# Patient Record
Sex: Male | Born: 1975
Health system: Southern US, Community
[De-identification: ages and names within clinical notes are randomized; demographics above are authoritative.]

## PROBLEM LIST (undated history)

## (undated) DIAGNOSIS — R51 Headache: Secondary | ICD-10-CM

## (undated) DIAGNOSIS — N200 Calculus of kidney: Secondary | ICD-10-CM

## (undated) DIAGNOSIS — H409 Unspecified glaucoma: Secondary | ICD-10-CM

---

## 2009-07-02 ENCOUNTER — Emergency Department (HOSPITAL_BASED_OUTPATIENT_CLINIC_OR_DEPARTMENT_OTHER)
Admission: EM | Admit: 2009-07-02 | Discharge: 2009-07-02 | Payer: Self-pay | Source: Home / Self Care | Admitting: Emergency Medicine

## 2009-07-02 ENCOUNTER — Ambulatory Visit: Payer: Self-pay | Admitting: Diagnostic Radiology

## 2010-06-08 ENCOUNTER — Emergency Department (HOSPITAL_BASED_OUTPATIENT_CLINIC_OR_DEPARTMENT_OTHER)
Admission: EM | Admit: 2010-06-08 | Discharge: 2010-06-08 | Payer: Self-pay | Source: Home / Self Care | Admitting: Emergency Medicine

## 2010-08-16 LAB — URINE MICROSCOPIC-ADD ON

## 2010-08-16 LAB — URINALYSIS, ROUTINE W REFLEX MICROSCOPIC
Glucose, UA: NEGATIVE mg/dL
Specific Gravity, Urine: 1.018 (ref 1.005–1.030)

## 2010-08-16 LAB — CBC
MCV: 85.6 fL (ref 78.0–100.0)
Platelets: 177 10*3/uL (ref 150–400)
RBC: 5.06 MIL/uL (ref 4.22–5.81)
WBC: 3.7 10*3/uL — ABNORMAL LOW (ref 4.0–10.5)

## 2010-08-16 LAB — DIFFERENTIAL
Basophils Absolute: 0 10*3/uL (ref 0.0–0.1)
Basophils Relative: 1 % (ref 0–1)
Eosinophils Relative: 2 % (ref 0–5)
Lymphocytes Relative: 63 % — ABNORMAL HIGH (ref 12–46)
Lymphs Abs: 2.3 10*3/uL (ref 0.7–4.0)
Monocytes Relative: 11 % (ref 3–12)
Neutro Abs: 0.9 10*3/uL — ABNORMAL LOW (ref 1.7–7.7)

## 2010-08-16 LAB — BASIC METABOLIC PANEL
CO2: 27 mEq/L (ref 19–32)
Calcium: 9.6 mg/dL (ref 8.4–10.5)
GFR calc non Af Amer: >60 mL/min (ref >60)
Glucose, Bld: 88 mg/dL (ref 70–99)
Potassium: 4.5 mEq/L (ref 3.5–5.1)
Sodium: 144 mEq/L (ref 135–145)

## 2010-08-23 LAB — URINALYSIS, ROUTINE W REFLEX MICROSCOPIC
Bilirubin Urine: NEGATIVE
Nitrite: NEGATIVE
Specific Gravity, Urine: 1.021 (ref 1.005–1.030)
pH: 6 (ref 5.0–8.0)

## 2010-08-23 LAB — CBC
MCHC: 33.6 g/dL (ref 30.0–36.0)
RBC: 4.59 MIL/uL (ref 4.22–5.81)

## 2010-08-23 LAB — BASIC METABOLIC PANEL
BUN: 21 mg/dL (ref 6–23)
CO2: 30 mEq/L (ref 19–32)
Chloride: 105 mEq/L (ref 96–112)
Creatinine, Ser: 1.2 mg/dL (ref 0.4–1.5)
Potassium: 4.6 mEq/L (ref 3.5–5.1)

## 2010-08-23 LAB — DIFFERENTIAL
Basophils Absolute: 0 10*3/uL (ref 0.0–0.1)
Basophils Relative: 1 % (ref 0–1)
Eosinophils Absolute: 0 10*3/uL (ref 0.0–0.7)
Monocytes Absolute: 0.3 10*3/uL (ref 0.1–1.0)
Monocytes Relative: 8 % (ref 3–12)
Neutro Abs: 2.6 10*3/uL (ref 1.7–7.7)
Neutrophils Relative %: 55 % (ref 43–77)

## 2011-01-12 ENCOUNTER — Emergency Department (INDEPENDENT_AMBULATORY_CARE_PROVIDER_SITE_OTHER): Payer: Self-pay

## 2011-01-12 ENCOUNTER — Encounter: Payer: Self-pay | Admitting: Emergency Medicine

## 2011-01-12 ENCOUNTER — Emergency Department (HOSPITAL_BASED_OUTPATIENT_CLINIC_OR_DEPARTMENT_OTHER)
Admission: EM | Admit: 2011-01-12 | Discharge: 2011-01-12 | Disposition: A | Discharge status: Home / Self Care | Payer: Self-pay | Attending: Emergency Medicine | Admitting: Emergency Medicine

## 2011-01-12 DIAGNOSIS — N200 Calculus of kidney: Secondary | ICD-10-CM | POA: Insufficient documentation

## 2011-01-12 DIAGNOSIS — N509 Disorder of male genital organs, unspecified: Secondary | ICD-10-CM

## 2011-01-12 DIAGNOSIS — N201 Calculus of ureter: Secondary | ICD-10-CM

## 2011-01-12 DIAGNOSIS — R109 Unspecified abdominal pain: Secondary | ICD-10-CM

## 2011-01-12 LAB — BASIC METABOLIC PANEL
BUN: 17 mg/dL (ref 6–23)
Creatinine, Ser: 1.2 mg/dL (ref 0.50–1.35)
GFR calc non Af Amer: >60 mL/min (ref >60)

## 2011-01-12 LAB — URINALYSIS, ROUTINE W REFLEX MICROSCOPIC
Nitrite: NEGATIVE
Specific Gravity, Urine: 1.017 (ref 1.005–1.030)
Urobilinogen, UA: 0.2 mg/dL (ref 0.0–1.0)

## 2011-01-12 LAB — URINE MICROSCOPIC-ADD ON

## 2011-01-12 MED ORDER — HYDROMORPHONE HCL 1 MG/ML IJ SOLN
1.0000 mg | Freq: Once | INTRAMUSCULAR | Status: AC
  Administered 2011-01-12: 1 mg via INTRAVENOUS
  Filled 2011-01-12: qty 1

## 2011-01-12 MED ORDER — KETOROLAC TROMETHAMINE 30 MG/ML IJ SOLN
30.0000 mg | Freq: Once | INTRAMUSCULAR | Status: AC
  Administered 2011-01-12: 30 mg via INTRAVENOUS
  Filled 2011-01-12: qty 1

## 2011-01-12 MED ORDER — SODIUM CHLORIDE 0.9 % IV BOLUS (SEPSIS): 1000.0000 mL | Freq: Once | INTRAVENOUS | Status: DC

## 2011-01-12 MED ORDER — OXYCODONE-ACETAMINOPHEN 5-325 MG PO TABS
1.0000 | ORAL_TABLET | Freq: Once | ORAL | Status: AC
  Administered 2011-01-12: 1 via ORAL
  Filled 2011-01-12: qty 1

## 2011-01-12 MED ORDER — SODIUM CHLORIDE 0.9 % IV SOLN
8.0000 mg | Freq: Once | INTRAVENOUS | Status: AC
  Administered 2011-01-12: 8 mg via INTRAVENOUS

## 2011-01-12 MED ORDER — TAMSULOSIN HCL 0.4 MG PO CAPS: 0.4000 mg | ORAL_CAPSULE | Freq: Every day | ORAL | Status: DC

## 2011-01-12 MED ORDER — HYDROMORPHONE HCL 1 MG/ML IJ SOLN
INTRAMUSCULAR | Status: AC
  Administered 2011-01-12: 1 mg
  Filled 2011-01-12: qty 1

## 2011-01-12 MED ORDER — ONDANSETRON HCL 4 MG PO TABS: 4.0000 mg | ORAL_TABLET | Freq: Four times a day (QID) | ORAL | Status: AC

## 2011-01-12 MED ORDER — IBUPROFEN 800 MG PO TABS: 800.0000 mg | ORAL_TABLET | Freq: Three times a day (TID) | ORAL | Status: AC | PRN

## 2011-01-12 MED ORDER — ONDANSETRON HCL 4 MG/2ML IJ SOLN
INTRAMUSCULAR | Status: AC
  Filled 2011-01-12: qty 4

## 2011-01-12 MED ORDER — OXYCODONE-ACETAMINOPHEN 5-325 MG PO TABS: 1.0000 | ORAL_TABLET | Freq: Four times a day (QID) | ORAL | Status: AC | PRN

## 2011-01-12 NOTE — ED Notes (Signed)
Pt reports Hx of R kidney stones with NV and pain x 24hrs vomited 250cc in triage

## 2011-01-12 NOTE — ED Notes (Signed)
Patient is resting comfortably.Returned from CT scan pain controlled

## 2011-01-12 NOTE — ED Notes (Signed)
Family at bedside. 

## 2011-01-12 NOTE — ED Provider Notes (Signed)
History     CSN: [id number] Arrival date & time: 01/12/2011 10:31 AM  Chief Complaint  Patient presents with  . Flank Pain    flank pain with vomiting Hx of kidney stone   Patient is a 35 y.o. male presenting with flank pain. The history is provided by the patient. No language interpreter was used.  Flank Pain This is a recurrent problem. The current episode started 3 to 5 hours ago. The problem occurs constantly. The problem has been gradually worsening. Associated symptoms include abdominal pain. Pertinent negatives include no chest pain, no headaches and no shortness of breath. The symptoms are aggravated by nothing. The symptoms are relieved by nothing. He has tried nothing for the symptoms.   The patient has 10 out of 10 left flank pain with radiation to the left testicle. Patient does not have a history of testicular trauma. He denies any history of exposure to STDs or penile discharge. Patient does have history of kidney stones previously. His pain is similar to that. The patient arrived actively vomiting. Patient has not required surgical intervention for this in the past.  History reviewed. No pertinent past medical history.  History reviewed. No pertinent past surgical history.  History reviewed. No pertinent family history.  History  Substance Use Topics  . Smoking status: Never Smoker   . Smokeless tobacco: Not on file  . Alcohol Use: No      Review of Systems  Constitutional: Negative.   HENT: Negative.   Eyes: Negative.   Respiratory: Negative.  Negative for shortness of breath.   Cardiovascular: Negative.  Negative for chest pain.  Gastrointestinal: Positive for nausea, vomiting and abdominal pain.  Genitourinary: Positive for flank pain and testicular pain. Negative for dysuria, frequency, hematuria, discharge, difficulty urinating and penile pain.  Musculoskeletal: Negative.   Skin: Negative.   Neurological: Negative.  Negative for headaches.  Hematological:  Negative.   Psychiatric/Behavioral: Negative.   All other systems reviewed and are negative.    Physical Exam  BP 145/85  Pulse 110  Temp(Src) 99 F (37.2 C) (Oral)  Resp 24  SpO2 99%  Physical Exam  Nursing note and vitals reviewed. Constitutional: He is oriented to person, place, and time. He appears well-developed and well-nourished. He appears distressed.  HENT:  Head: Normocephalic and atraumatic.  Eyes: Pupils are equal, round, and reactive to light.  Neck: Normal range of motion.  Cardiovascular: Regular rhythm.  Tachycardia present.   Pulmonary/Chest: Effort normal and breath sounds normal.  Abdominal: Soft. There is tenderness in the left lower quadrant. There is CVA tenderness. There is no rigidity and no guarding.  Genitourinary: Penis normal. Left testis shows tenderness. Left testis shows no swelling. Left testis is descended. Cremasteric reflex is not absent on the left side. Circumcised.       Patient has curve of penis to right  Musculoskeletal: Normal range of motion.  Neurological: He is alert and oriented to person, place, and time. No cranial nerve deficit. He exhibits normal muscle tone. Coordination normal.  Skin: Skin is warm. No rash noted. He is diaphoretic.  Psychiatric: He has a normal mood and affect.    ED Course  Procedures  MDM Patient was examined for his symptoms. He was treated symptomatically with Dilaudid and Zofran given the extent of his comfort. Patient received 2 L of normal saline IV bolus. Iced patient received very little relief I did order a ultrasound of his testicles to ensure that there was no testicular torsion today.  This was negative. Patient did have a CT scan nonconstant which showed no obstructing stones. Patient did not have significant change in his creatinine. Patient was given Toradol 30 mg IV and a tab of Percocet by mouth. He was able to tolerate by mouth intake and therefore was able to be discharged home with  prescriptions for Motrin, Percocet, Zofran, and Flomax. Patient has seen a urologist previously and can followup with them if this continues to be a problem. Patient and family were told that the patient can return if he is unable to tolerate by mouth intake or has worsening of his pain despite therapies provided.  Assessment: 35 year old male who presents today with left flank pain and nonobstructive nephrolithiasis.  Plan: As above.      Cyndra Numbers, MD 01/12/11 [phone]

## 2011-01-12 NOTE — ED Notes (Signed)
Pt verbalizes use of meds wife at side

## 2014-02-07 ENCOUNTER — Encounter (HOSPITAL_BASED_OUTPATIENT_CLINIC_OR_DEPARTMENT_OTHER): Payer: Self-pay | Admitting: Emergency Medicine

## 2014-02-07 ENCOUNTER — Emergency Department (HOSPITAL_BASED_OUTPATIENT_CLINIC_OR_DEPARTMENT_OTHER)
Admission: EM | Admit: 2014-02-07 | Discharge: 2014-02-07 | Disposition: A | Discharge status: Home / Self Care | Payer: PRIVATE HEALTH INSURANCE | Attending: Emergency Medicine | Admitting: Emergency Medicine

## 2014-02-07 DIAGNOSIS — R112 Nausea with vomiting, unspecified: Secondary | ICD-10-CM | POA: Insufficient documentation

## 2014-02-07 DIAGNOSIS — R109 Unspecified abdominal pain: Secondary | ICD-10-CM | POA: Insufficient documentation

## 2014-02-07 DIAGNOSIS — Z87442 Personal history of urinary calculi: Secondary | ICD-10-CM | POA: Diagnosis not present

## 2014-02-07 DIAGNOSIS — R319 Hematuria, unspecified: Secondary | ICD-10-CM | POA: Diagnosis not present

## 2014-02-07 DIAGNOSIS — Z79899 Other long term (current) drug therapy: Secondary | ICD-10-CM | POA: Diagnosis not present

## 2014-02-07 LAB — URINALYSIS, ROUTINE W REFLEX MICROSCOPIC
Bilirubin Urine: NEGATIVE
Glucose, UA: NEGATIVE mg/dL
KETONES UR: NEGATIVE mg/dL
NITRITE: NEGATIVE
PROTEIN: 100 mg/dL — AB
Specific Gravity, Urine: 1.015 (ref 1.005–1.030)
UROBILINOGEN UA: 0.2 mg/dL (ref 0.0–1.0)
pH: 5.5 (ref 5.0–8.0)

## 2014-02-07 LAB — URINE MICROSCOPIC-ADD ON

## 2014-02-07 MED ORDER — PROMETHAZINE HCL 25 MG PO TABS: 25.0000 mg | ORAL_TABLET | Freq: Four times a day (QID) | ORAL | Status: AC | PRN

## 2014-02-07 MED ORDER — ONDANSETRON HCL 4 MG/2ML IJ SOLN
4.0000 mg | Freq: Once | INTRAMUSCULAR | Status: AC
  Administered 2014-02-07: 4 mg via INTRAVENOUS
  Filled 2014-02-07: qty 2

## 2014-02-07 MED ORDER — OXYCODONE-ACETAMINOPHEN 5-325 MG PO TABS: 2.0000 | ORAL_TABLET | ORAL | Status: DC | PRN

## 2014-02-07 MED ORDER — KETOROLAC TROMETHAMINE 30 MG/ML IJ SOLN
30.0000 mg | Freq: Once | INTRAMUSCULAR | Status: AC
  Administered 2014-02-07: 30 mg via INTRAVENOUS
  Filled 2014-02-07: qty 1

## 2014-02-07 MED ORDER — FENTANYL CITRATE 0.05 MG/ML IJ SOLN
50.0000 ug | Freq: Once | INTRAMUSCULAR | Status: AC
  Administered 2014-02-07: 50 ug via INTRAVENOUS
  Filled 2014-02-07: qty 2

## 2014-02-07 MED ORDER — HYDROMORPHONE HCL PF 1 MG/ML IJ SOLN
1.0000 mg | Freq: Once | INTRAMUSCULAR | Status: AC
  Administered 2014-02-07: 1 mg via INTRAVENOUS
  Filled 2014-02-07: qty 1

## 2014-02-07 NOTE — ED Provider Notes (Signed)
CSN: [id number]     Arrival date & time 02/07/14  4098 History   First MD Initiated Contact with Patient 02/07/14 [phone]     Chief Complaint  Patient presents with  . Flank Pain      Patient is a 38 y.o. male presenting with flank pain. The history is provided by the patient.  Flank Pain This is a new problem. The current episode started more than 2 days ago. The problem occurs daily. The problem has been gradually worsening. Pertinent negatives include no chest pain and no shortness of breath. Nothing aggravates the symptoms. Nothing relieves the symptoms.   Pt presents for right flank pain He reports h/o kidney stones similar tonight's pain He reports pain radiates to his right testicle No fever He reports nausea/vomiting  He denies h/o urologic procedure   PMH - none History  Substance Use Topics  . Smoking status: Never Smoker   . Smokeless tobacco: Not on file  . Alcohol Use: Yes     Comment: social    Review of Systems  Constitutional: Negative for fever.  Respiratory: Negative for shortness of breath.   Cardiovascular: Negative for chest pain.  Gastrointestinal: Positive for vomiting.  Genitourinary: Positive for flank pain.  All other systems reviewed and are negative.     Allergies  Review of patient's allergies indicates no known allergies.  Home Medications   Prior to Admission medications   Medication Sig Start Date End Date Taking? Authorizing Provider  Tamsulosin HCl (FLOMAX) 0.4 MG CAPS Take 1 capsule (0.4 mg total) by mouth daily. 01/12/11   Meagan Hunt, MD   BP 160/103  Pulse 70  Temp(Src) 98.4 F (36.9 C) (Oral)  Resp 20  Ht  (1.676 m)  Wt 165 lb (74.844 kg)  BMI 26.64 kg/m2  SpO2 98% Physical Exam CONSTITUTIONAL: Well developed/well nourished HEAD: Normocephalic/atraumatic EYES: EOMI/PERRL ENMT: Mucous membranes moist NECK: supple no meningeal signs SPINE:entire spine nontender CV: S1/S2 noted, no murmurs/rubs/gallops noted LUNGS:  Lungs are clear to auscultation bilaterally, no apparent distress ABDOMEN: soft, nontender, no rebound or guarding YN:WGNFA cva tenderness No inguinal hernia.  Mild scrotal tenderness but no edema/erythema noted.  family member at bedside during exam at patient request NEURO: Pt is awake/alert, moves all extremitiesx4 EXTREMITIES: pulses normal, full ROM SKIN: warm, color normal PSYCH: no abnormalities of mood noted  ED Course  Procedures  4:10 AM Suspect kidney stone Will treat pain and reassess 5:30 AM Pt improving Previous CT imaging revealed kidney stones bilaterally.  Suspect ureteral colic that is improving and he is without fever or signs of infection.  Will defer imaging for now 5:48 AM Pt improved and taking PO fluids He is resting comfortably Low suspicion for acute abdominal emergency He has no signs of testicular torsion We discussed strict return precautions Stable for d/c home Labs Review Labs Reviewed  URINALYSIS, ROUTINE W REFLEX MICROSCOPIC - Abnormal; Notable for the following:    Color, Urine RED (*)    APPearance CLOUDY (*)    Hgb urine dipstick LARGE (*)    Protein, ur 100 (*)    Leukocytes, UA TRACE (*)    All other components within normal limits  URINE MICROSCOPIC-ADD ON     MDM   Final diagnoses:  Flank pain  Hematuria    Nursing notes including past medical history and social history reviewed and considered in documentation Labs/vital reviewed and considered Previous records reviewed and considered     Joya Gaskins, MD 02/07/14  0549 

## 2014-02-07 NOTE — ED Notes (Signed)
Pt c/o rt flank pain with n/v, hx of kidney stones, c/o hematuria

## 2014-02-09 ENCOUNTER — Encounter (HOSPITAL_BASED_OUTPATIENT_CLINIC_OR_DEPARTMENT_OTHER): Payer: Self-pay | Admitting: Emergency Medicine

## 2014-02-09 ENCOUNTER — Emergency Department (HOSPITAL_BASED_OUTPATIENT_CLINIC_OR_DEPARTMENT_OTHER): Payer: PRIVATE HEALTH INSURANCE

## 2014-02-09 ENCOUNTER — Inpatient Hospital Stay (HOSPITAL_BASED_OUTPATIENT_CLINIC_OR_DEPARTMENT_OTHER)
Admission: EM | Admit: 2014-02-09 | Discharge: 2014-02-10 | DRG: 684 | Disposition: A | Discharge status: Home / Self Care | Payer: PRIVATE HEALTH INSURANCE | Attending: Internal Medicine | Admitting: Internal Medicine

## 2014-02-09 DIAGNOSIS — E860 Dehydration: Secondary | ICD-10-CM | POA: Diagnosis present

## 2014-02-09 DIAGNOSIS — Z79899 Other long term (current) drug therapy: Secondary | ICD-10-CM | POA: Diagnosis not present

## 2014-02-09 DIAGNOSIS — R112 Nausea with vomiting, unspecified: Secondary | ICD-10-CM

## 2014-02-09 DIAGNOSIS — H409 Unspecified glaucoma: Secondary | ICD-10-CM | POA: Diagnosis present

## 2014-02-09 DIAGNOSIS — N200 Calculus of kidney: Secondary | ICD-10-CM

## 2014-02-09 DIAGNOSIS — N179 Acute kidney failure, unspecified: Secondary | ICD-10-CM | POA: Diagnosis present

## 2014-02-09 DIAGNOSIS — R109 Unspecified abdominal pain: Secondary | ICD-10-CM | POA: Diagnosis present

## 2014-02-09 HISTORY — DX: Headache: R51

## 2014-02-09 HISTORY — DX: Unspecified glaucoma: H40.9

## 2014-02-09 HISTORY — DX: Calculus of kidney: N20.0

## 2014-02-09 LAB — CBC WITH DIFFERENTIAL/PLATELET
Basophils Absolute: 0 K/uL (ref 0.0–0.1)
Basophils Relative: 0 % (ref 0–1)
Eosinophils Absolute: 0 K/uL (ref 0.0–0.7)
Eosinophils Relative: 0 % (ref 0–5)
HCT: 38.6 % — ABNORMAL LOW (ref 39.0–52.0)
Hemoglobin: 13 g/dL (ref 13.0–17.0)
Lymphocytes Relative: 9 % — ABNORMAL LOW (ref 12–46)
Lymphs Abs: 0.6 K/uL — ABNORMAL LOW (ref 0.7–4.0)
MCH: 30.1 pg (ref 26.0–34.0)
MCHC: 33.7 g/dL (ref 30.0–36.0)
MCV: 89.4 fL (ref 78.0–100.0)
Monocytes Absolute: 0.5 K/uL (ref 0.1–1.0)
Monocytes Relative: 7 % (ref 3–12)
Neutro Abs: 5.9 K/uL (ref 1.7–7.7)
Neutrophils Relative %: 84 % — ABNORMAL HIGH (ref 43–77)
Platelets: 125 K/uL — ABNORMAL LOW (ref 150–400)
RBC: 4.32 MIL/uL (ref 4.22–5.81)
RDW: 11.5 % (ref 11.5–15.5)
WBC: 7 K/uL (ref 4.0–10.5)

## 2014-02-09 LAB — BASIC METABOLIC PANEL
ANION GAP: 16 — AB (ref 5–15)
BUN: 13 mg/dL (ref 6–23)
CHLORIDE: 99 meq/L (ref 96–112)
CO2: 24 mEq/L (ref 19–32)
Calcium: 10.1 mg/dL (ref 8.4–10.5)
Creatinine, Ser: 1.6 mg/dL — ABNORMAL HIGH (ref 0.50–1.35)
GFR calc non Af Amer: 53 mL/min — ABNORMAL LOW (ref >90)
GFR, EST AFRICAN AMERICAN: 61 mL/min — AB (ref >90)
Glucose, Bld: 103 mg/dL — ABNORMAL HIGH (ref 70–99)
POTASSIUM: 4.4 meq/L (ref 3.7–5.3)
Sodium: 139 mEq/L (ref 137–147)

## 2014-02-09 LAB — URINALYSIS, ROUTINE W REFLEX MICROSCOPIC
BILIRUBIN URINE: NEGATIVE
Glucose, UA: NEGATIVE mg/dL
Leukocytes, UA: NEGATIVE
NITRITE: NEGATIVE
PROTEIN: 30 mg/dL — AB
Specific Gravity, Urine: 1.018 (ref 1.005–1.030)
Urobilinogen, UA: 1 mg/dL (ref 0.0–1.0)
pH: 8 (ref 5.0–8.0)

## 2014-02-09 LAB — URINE MICROSCOPIC-ADD ON

## 2014-02-09 MED ORDER — OXYCODONE-ACETAMINOPHEN 5-325 MG PO TABS: 2.0000 | ORAL_TABLET | ORAL | Status: DC | PRN

## 2014-02-09 MED ORDER — SODIUM CHLORIDE 0.9 % IV BOLUS (SEPSIS)
1000.0000 mL | Freq: Once | INTRAVENOUS | Status: AC
  Administered 2014-02-09: 1000 mL via INTRAVENOUS

## 2014-02-09 MED ORDER — MORPHINE SULFATE 4 MG/ML IJ SOLN
4.0000 mg | INTRAMUSCULAR | Status: DC | PRN
  Administered 2014-02-09 – 2014-02-10 (×3): 4 mg via INTRAVENOUS
  Filled 2014-02-09 (×3): qty 1

## 2014-02-09 MED ORDER — ACETAMINOPHEN 325 MG PO TABS: 650.0000 mg | ORAL_TABLET | Freq: Four times a day (QID) | ORAL | Status: DC | PRN

## 2014-02-09 MED ORDER — ONDANSETRON HCL 4 MG/2ML IJ SOLN: 4.0000 mg | Freq: Three times a day (TID) | INTRAMUSCULAR | Status: AC | PRN

## 2014-02-09 MED ORDER — HYDROMORPHONE HCL PF 1 MG/ML IJ SOLN
INTRAMUSCULAR | Status: AC
  Administered 2014-02-09: 1 mg via INTRAVENOUS
  Filled 2014-02-09: qty 1

## 2014-02-09 MED ORDER — DOCUSATE SODIUM 100 MG PO CAPS
100.0000 mg | ORAL_CAPSULE | Freq: Two times a day (BID) | ORAL | Status: DC
  Administered 2014-02-10: 100 mg via ORAL
  Filled 2014-02-09 (×3): qty 1

## 2014-02-09 MED ORDER — ACETAMINOPHEN 650 MG RE SUPP: 650.0000 mg | Freq: Four times a day (QID) | RECTAL | Status: DC | PRN

## 2014-02-09 MED ORDER — HYDROMORPHONE HCL PF 1 MG/ML IJ SOLN
1.0000 mg | Freq: Once | INTRAMUSCULAR | Status: AC
  Administered 2014-02-09: 1 mg via INTRAVENOUS
  Filled 2014-02-09: qty 1

## 2014-02-09 MED ORDER — DEXTROSE 5 % IV SOLN
1.0000 g | Freq: Once | INTRAVENOUS | Status: AC
  Administered 2014-02-09: 1 g via INTRAVENOUS

## 2014-02-09 MED ORDER — HYDROMORPHONE HCL PF 1 MG/ML IJ SOLN
1.0000 mg | Freq: Once | INTRAMUSCULAR | Status: AC
  Administered 2014-02-09: 1 mg via INTRAVENOUS

## 2014-02-09 MED ORDER — HYDROMORPHONE HCL PF 1 MG/ML IJ SOLN
1.0000 mg | INTRAMUSCULAR | Status: AC | PRN
  Administered 2014-02-10 (×2): 1 mg via INTRAVENOUS
  Filled 2014-02-09 (×2): qty 1

## 2014-02-09 MED ORDER — DEXTROSE 5 % IV SOLN: 1.0000 g | INTRAVENOUS | Status: DC

## 2014-02-09 MED ORDER — ONDANSETRON HCL 4 MG/2ML IJ SOLN
4.0000 mg | Freq: Once | INTRAMUSCULAR | Status: AC
  Administered 2014-02-09: 4 mg via INTRAVENOUS
  Filled 2014-02-09: qty 2

## 2014-02-09 MED ORDER — SODIUM CHLORIDE 0.9 % IV SOLN
INTRAVENOUS | Status: AC
  Administered 2014-02-10 (×2): via INTRAVENOUS

## 2014-02-09 NOTE — ED Notes (Signed)
Patient here with increasing right groin/flank pain x 1 day, hx of kidney stones and reports this time cannot pass. Did not get CT scan 2 days ago at that visit. Unable to keep the oxycodone nor the phenergan down.

## 2014-02-09 NOTE — ED Provider Notes (Signed)
CSN: [id number]     Arrival date & time 02/09/14  1818 History   First MD Initiated Contact with Patient 02/09/14 1839     Chief Complaint  Patient presents with  . Flank Pain     (Consider location/radiation/quality/duration/timing/severity/associated sxs/prior Treatment) HPI  Gary Peters is a 38 y.o. male complaining of severe pain in the right flank radiating to the right groin and testicle associated with several episodes of nonbloody, nonbilious, no coffee-ground emesis. Patient had a history of multiple kidney stones in the past, this feels similar. He was seen several days ago and given prescription for Percocet and Phenergan, however pain is severe and he's been vomiting up the pain medication. Patient denies fever, chills, dysuria or urethral discharge. States that he is followed at [facility] in the past, she has not required any intervention to pass stones in the past. When he was seen 2 days ago no imaging was performed. Patient states that he passed one stone on the left side yesterday. He has brought the stone with him.  Past Medical History  Diagnosis Date  . Kidney stones   . Headache(784.0)   . Nephrolithiasis 02/09/2014   History reviewed. No pertinent past surgical history. History reviewed. No pertinent family history. History  Substance Use Topics  . Smoking status: Never Smoker   . Smokeless tobacco: Not on file  . Alcohol Use: 1.2 oz/week    2 Glasses of wine per week     Comment: social    Review of Systems  10 systems reviewed and found to be negative, except as noted in the HPI.   Allergies  Other  Home Medications   Prior to Admission medications   Medication Sig Start Date End Date Taking? Authorizing Provider  brimonidine-timolol (COMBIGAN) 0.2-0.5 % ophthalmic solution Place 1 drop into both eyes every 12 (twelve) hours.   Yes Historical Provider, MD  brinzolamide (AZOPT) 1 % ophthalmic suspension Place 1 drop into both eyes 2 (two)  times daily.   Yes Historical Provider, MD  oxyCODONE-acetaminophen (PERCOCET/ROXICET) 5-325 MG per tablet Take 2 tablets by mouth every 4 (four) hours as needed for severe pain. 02/07/14  Yes Joya Gaskins, MD  promethazine (PHENERGAN) 25 MG tablet Take 1 tablet (25 mg total) by mouth every 6 (six) hours as needed for nausea or vomiting. 02/07/14  Yes Joya Gaskins, MD  Travoprost, BAK Free, (TRAVATAN) 0.004 % SOLN ophthalmic solution Place 1 drop into both eyes at bedtime.   Yes Historical Provider, MD  valACYclovir (VALTREX) 500 MG tablet Take 500 mg by mouth 2 (two) times daily.   Yes Historical Provider, MD   BP 165/95  Pulse 73  Temp(Src) 98.2 F (36.8 C) (Oral)  Resp 20  Ht  (1.702 m)  Wt 161 lb 2.5 oz (73.1 kg)  BMI 25.23 kg/m2  SpO2 95% Physical Exam  Nursing note and vitals reviewed. Constitutional: He is oriented to person, place, and time. He appears well-developed and well-nourished. No distress.  Appears tired  HENT:  Head: Normocephalic.  Eyes: Conjunctivae and EOM are normal.  Cardiovascular: Normal rate, regular rhythm and intact distal pulses.   Pulmonary/Chest: Effort normal and breath sounds normal. No stridor. No respiratory distress. He has no wheezes. He has no rales. He exhibits no tenderness.  Abdominal: Soft. Bowel sounds are normal. He exhibits no distension and no mass. There is no tenderness. There is no rebound and no guarding.  Genitourinary:  Bilateral CVA tenderness to palpation, worse  on the right than the left.  Musculoskeletal: Normal range of motion.  Neurological: He is alert and oriented to person, place, and time.  Psychiatric: He has a normal mood and affect.    ED Course  Procedures (including critical care time) Labs Review Labs Reviewed  CBC WITH DIFFERENTIAL - Abnormal; Notable for the following:    HCT 38.6 (*)    Platelets 125 (*)    Neutrophils Relative % 84 (*)    Lymphocytes Relative 9 (*)    Lymphs Abs 0.6 (*)     All other components within normal limits  URINALYSIS, ROUTINE W REFLEX MICROSCOPIC - Abnormal; Notable for the following:    APPearance CLOUDY (*)    Hgb urine dipstick LARGE (*)    Ketones, ur >80 (*)    Protein, ur 30 (*)    All other components within normal limits  BASIC METABOLIC PANEL - Abnormal; Notable for the following:    Glucose, Bld 103 (*)    Creatinine, Ser 1.60 (*)    GFR calc non Af Amer 53 (*)    GFR calc Af Amer 61 (*)    Anion gap 16 (*)    All other components within normal limits  URINE MICROSCOPIC-ADD ON - Abnormal; Notable for the following:    Bacteria, UA MANY (*)    All other components within normal limits  URINE CULTURE  MAGNESIUM  PHOSPHORUS  TSH  COMPREHENSIVE METABOLIC PANEL  CBC  CREATININE, URINE, RANDOM  SODIUM, URINE, RANDOM    Imaging Review Ct Abdomen Pelvis Wo Contrast  02/09/2014   CLINICAL DATA:  Right flank pain 1 day. History of kidney stones. Nausea and vomiting.  EXAM: CT ABDOMEN AND PELVIS WITHOUT CONTRAST  TECHNIQUE: Multidetector CT imaging of the abdomen and pelvis was performed following the standard protocol without IV contrast.  COMPARISON:  12/16/2011, 07/19/2011 and 01/12/2011.  FINDINGS: Lung bases are within normal.  Abdominal images demonstrate the liver, spleen, pancreas, gallbladder and adrenal glands to be within normal. The appendix is retrocecal in location and otherwise normal. There is mild diverticulosis of the colon.  Kidneys are normal in size with multiple bilateral stones. There is a 1 cm cyst over the mid pole of the right kidney. There is minimal dilatation of the right intrarenal collecting system. There is a single stone versus 2 adjacent stones over the right mid ureter measuring 6-7 mm when measured together. Left ureter is normal.  Pelvic images demonstrate a normal bladder, prostate and rectum. There is a tiny amount of free fluid. Remaining bones soft tissues are within normal.  IMPRESSION: Moderate bilateral  nephrolithiasis. Single stone versus 2 adjacent stones measuring 6-7 mm when measured together over the right mid ureter causing low-grade obstruction.  1 cm right renal cyst.  Mild diverticulosis of the colon.   Electronically Signed   By: Elberta Fortis M.D.   On: 02/09/2014 19:02     EKG Interpretation None      MDM   Final diagnoses:  AKI (acute kidney injury)  Kidney stone    Filed Vitals:   02/09/14 1828 02/09/14 2153 02/09/14 2310  BP: 147/103 124/81 165/95  Pulse: 77 71 73  Temp: 98 F (36.7 C) 98.6 F (37 C) 98.2 F (36.8 C)  TempSrc: Oral Oral Oral  Resp: Height:    (1.702 m)  Weight:   161 lb 2.5 oz (73.1 kg)  SpO2: 100% 96% 95%    Medications  morphine 4  MG/ML injection 4 mg (4 mg Intravenous Given 02/09/14 2234)  HYDROmorphone (DILAUDID) injection 1 mg (1 mg Intravenous Given 02/10/14 0014)  ondansetron (ZOFRAN) injection 4 mg (not administered)  cefTRIAXone (ROCEPHIN) 1 g in dextrose 5 % 50 mL IVPB (not administered)  oxyCODONE-acetaminophen (PERCOCET/ROXICET) 5-325 MG per tablet 2 tablet (not administered)  acetaminophen (TYLENOL) tablet 650 mg (not administered)    Or  acetaminophen (TYLENOL) suppository 650 mg (not administered)  docusate sodium (COLACE) capsule 100 mg (100 mg Oral Not Given 02/10/14 0000)  0.9 %  sodium chloride infusion ( Intravenous New Bag/Given 02/10/14 0014)  sodium chloride 0.9 % bolus 1,000 mL (1,000 mLs Intravenous New Bag/Given 02/09/14 1916)  ondansetron (ZOFRAN) injection 4 mg (4 mg Intravenous Given 02/09/14 1916)  HYDROmorphone (DILAUDID) injection 1 mg (1 mg Intravenous Given 02/09/14 2000)  HYDROmorphone (DILAUDID) injection 1 mg (1 mg Intravenous Given 02/09/14 2118)  sodium chloride 0.9 % bolus 1,000 mL (1,000 mLs Intravenous New Bag/Given 02/09/14 2118)  cefTRIAXone (ROCEPHIN) 1 g in dextrose 5 % 50 mL IVPB (1 g Intravenous New Bag/Given 02/09/14 2236)    Gary Peters is a 38 y.o. male presenting with right flank pain  consistent with prior kidney stones. CT shows single stone versus 2 adjacent stones measuring 6-7 mm. There is mild bilateral hydronephrosis.   8:20 p.m.: Patient seen and reassess at the bedside. States that his pain is improved slightly to 7/10. Nausea has resolved.  Urinalysis shows ketones and many bacteria, no white blood cells, nitrites or leukocytes, we'll hold off on antibiotic administration at this time as patient is afebrile with no white blood cell count and normal vital signs. Urine culture is ordered.  Patient's creatinine is elevated at 1.60, Toradol is contraindicated. Patient will be given another dose of Dilaudid.Marland Kitchen He is dehydrated with greater than 80 ketones in the urine. Patient also has a mildly reduced platelet count at 125.  Multiple doses of Dilaudid her not controlling the patient's pain. Considering the acute kidney injury and the large stone he will need admission to the hospital and urologic consult.  Case discussed with [facility] hospitalist Dr. Adela Glimpse who accepts admission to [facility]. She has asked me to query him on alcohol consumption to 2 low platelet count. Patient states he drinks proximally 3 glasses of wine per week.  Neurology consult from Dr. Berneice Heinrich appreciated: We have discussed the findings of the urinalysis and he does not have any specific recommendations for antibiotics. Patient will be given 1 g of Rocephin. I have asked that the urology service evaluate him in the a.m.     Wynetta Emery, PA-C 02/10/14 [phone]

## 2014-02-10 ENCOUNTER — Encounter (HOSPITAL_COMMUNITY): Payer: Self-pay | Admitting: *Deleted

## 2014-02-10 DIAGNOSIS — R112 Nausea with vomiting, unspecified: Secondary | ICD-10-CM | POA: Diagnosis present

## 2014-02-10 LAB — URINE CULTURE
Colony Count: NO GROWTH
Culture: NO GROWTH

## 2014-02-10 LAB — COMPREHENSIVE METABOLIC PANEL
ALT: 12 U/L (ref 0–53)
ANION GAP: 12 (ref 5–15)
AST: 18 U/L (ref 0–37)
Albumin: 3.4 g/dL — ABNORMAL LOW (ref 3.5–5.2)
Alkaline Phosphatase: 35 U/L — ABNORMAL LOW (ref 39–117)
BUN: 12 mg/dL (ref 6–23)
CO2: 26 mEq/L (ref 19–32)
Calcium: 8.9 mg/dL (ref 8.4–10.5)
Chloride: 100 mEq/L (ref 96–112)
Creatinine, Ser: 1.29 mg/dL (ref 0.50–1.35)
GFR calc Af Amer: 79 mL/min — ABNORMAL LOW (ref >90)
GFR calc non Af Amer: 68 mL/min — ABNORMAL LOW (ref >90)
Glucose, Bld: 111 mg/dL — ABNORMAL HIGH (ref 70–99)
POTASSIUM: 4 meq/L (ref 3.7–5.3)
Sodium: 138 mEq/L (ref 137–147)
TOTAL PROTEIN: 7.1 g/dL (ref 6.0–8.3)
Total Bilirubin: 0.4 mg/dL (ref 0.3–1.2)

## 2014-02-10 LAB — PHOSPHORUS: PHOSPHORUS: 3.3 mg/dL (ref 2.3–4.6)

## 2014-02-10 LAB — CBC
HEMATOCRIT: 41.2 % (ref 39.0–52.0)
Hemoglobin: 13.5 g/dL (ref 13.0–17.0)
MCH: 29.4 pg (ref 26.0–34.0)
MCHC: 32.8 g/dL (ref 30.0–36.0)
MCV: 89.8 fL (ref 78.0–100.0)
Platelets: 179 10*3/uL (ref 150–400)
RBC: 4.59 MIL/uL (ref 4.22–5.81)
RDW: 11.9 % (ref 11.5–15.5)
WBC: 7.9 10*3/uL (ref 4.0–10.5)

## 2014-02-10 LAB — CREATININE, URINE, RANDOM: Creatinine, Urine: 183.1 mg/dL

## 2014-02-10 LAB — SODIUM, URINE, RANDOM: SODIUM UR: 116 meq/L

## 2014-02-10 LAB — MAGNESIUM: Magnesium: 1.8 mg/dL (ref 1.5–2.5)

## 2014-02-10 LAB — TSH: TSH: 0.801 u[IU]/mL (ref 0.350–4.500)

## 2014-02-10 MED ORDER — BRIMONIDINE TARTRATE-TIMOLOL 0.2-0.5 % OP SOLN
1.0000 [drp] | Freq: Two times a day (BID) | OPHTHALMIC | Status: DC
Start: 2014-02-10 — End: 2014-02-10

## 2014-02-10 MED ORDER — TAMSULOSIN HCL 0.4 MG PO CAPS
0.4000 mg | ORAL_CAPSULE | Freq: Every day | ORAL | Status: DC
  Administered 2014-02-10: 0.4 mg via ORAL
  Filled 2014-02-10: qty 1

## 2014-02-10 MED ORDER — ONDANSETRON HCL 4 MG PO TABS: 4.0000 mg | ORAL_TABLET | Freq: Three times a day (TID) | ORAL | Status: AC | PRN

## 2014-02-10 MED ORDER — ONDANSETRON HCL 4 MG PO TABS: 4.0000 mg | ORAL_TABLET | Freq: Three times a day (TID) | ORAL | Status: DC | PRN

## 2014-02-10 MED ORDER — VALACYCLOVIR HCL 500 MG PO TABS
500.0000 mg | ORAL_TABLET | Freq: Every day | ORAL | Status: DC
  Administered 2014-02-10: 500 mg via ORAL
  Filled 2014-02-10: qty 1

## 2014-02-10 MED ORDER — BRIMONIDINE TARTRATE 0.2 % OP SOLN
1.0000 [drp] | Freq: Two times a day (BID) | OPHTHALMIC | Status: DC
Start: 2014-02-10 — End: 2014-02-10
  Administered 2014-02-10 (×2): 1 [drp] via OPHTHALMIC
  Filled 2014-02-10: qty 5

## 2014-02-10 MED ORDER — TIMOLOL MALEATE 0.5 % OP SOLN
1.0000 [drp] | Freq: Two times a day (BID) | OPHTHALMIC | Status: DC
  Administered 2014-02-10 (×2): 1 [drp] via OPHTHALMIC
  Filled 2014-02-10: qty 5

## 2014-02-10 MED ORDER — KETOROLAC TROMETHAMINE 30 MG/ML IJ SOLN
30.0000 mg | Freq: Once | INTRAMUSCULAR | Status: AC
  Administered 2014-02-10: 30 mg via INTRAVENOUS
  Filled 2014-02-10: qty 1

## 2014-02-10 MED ORDER — BRINZOLAMIDE 1 % OP SUSP
1.0000 [drp] | Freq: Two times a day (BID) | OPHTHALMIC | Status: DC
  Administered 2014-02-10 (×2): 1 [drp] via OPHTHALMIC
  Filled 2014-02-10: qty 10

## 2014-02-10 MED ORDER — OXYCODONE-ACETAMINOPHEN 5-325 MG PO TABS: 1.0000 | ORAL_TABLET | Freq: Four times a day (QID) | ORAL | Status: AC | PRN

## 2014-02-10 MED ORDER — LATANOPROST 0.005 % OP SOLN
1.0000 [drp] | Freq: Every day | OPHTHALMIC | Status: DC
  Filled 2014-02-10: qty 2.5

## 2014-02-10 NOTE — Progress Notes (Signed)
Patient arrived via care link from [facility]. Patient is alert and oriented, no respiratory difficulty noted. Reports bilateral flank pain 7/10 at this time, states left is worse. Admitting doctor notified of arrival.

## 2014-02-10 NOTE — Care Management Note (Signed)
    Page 1 of 1   02/10/2014     12:42:57 PM CARE MANAGEMENT NOTE 02/10/2014  Patient:  Gary Peters, Gary Peters   Account Number:  [id number]  Date Initiated:  02/10/2014  Documentation initiated by:  Lanier Clam  Subjective/Objective Assessment:   38 Y/O M ADMITTED Gary Peters.     Action/Plan:   FROM HOME.   Anticipated DC Date:  02/10/2014   Anticipated DC Plan:  HOME/SELF CARE      DC Planning Services  CM consult      Choice offered to / List presented to:             Status of service:  Completed, signed off Medicare Important Message given?   (If response is "NO", the following Medicare IM given date fields will be blank) Date Medicare IM given:   Medicare IM given by:   Date Additional Medicare IM given:   Additional Medicare IM given by:    Discharge Disposition:  HOME/SELF CARE  Per UR Regulation:  Reviewed for med. necessity/level of care/duration of stay  If discussed at Long Length of Stay Meetings, dates discussed:    Comments:  02/10/14 Gary Giarratano RN,BSN NCM [phone] NO D/C NEEDS OR ORDERS.

## 2014-02-10 NOTE — H&P (Signed)
PCP: Almedia Balls, MD [place]   Chief Complaint:  Back pain  HPI: Gary Peters is a 38 y.o. male   has a past medical history of Kidney stones; Headache(784.0); Nephrolithiasis (02/09/2014); and Glaucoma.   Presented with  1 week hx of back pain radiating to groin typical for his kidney stone attack. # days ago he has passed a stone but still has sensation of discomfort on the right. CT scan showed possibly 2 stones measuring together as 6 mm vs one stone of that size. Patient reports that the pain is now desended to his right testicle.  He denies any fever or chills. Urology (Dr. Berneice Heinrich) have been consulted and will see patient in AM.   Hospitalist was called for admission for ARF in the setting of dehydration and   Review of Systems:    Pertinent positives include:abdominal pain, back pain testicular pain   nausea, vomiting,   Constitutional:  No weight loss, night sweats, Fevers, chills, fatigue, weight loss  HEENT:  No headaches, Difficulty swallowing,Tooth/dental problems,Sore throat,  No sneezing, itching, ear ache, nasal congestion, post nasal drip,  Cardio-vascular:  No chest pain, Orthopnea, PND, anasarca, dizziness, palpitations.no Bilateral lower extremity swelling  GI:  No heartburn, indigestion,diarrhea, change in bowel habits, loss of appetite, melena, blood in stool, hematemesis Resp:  no shortness of breath at rest. No dyspnea on exertion, No excess mucus, no productive cough, No non-productive cough, No coughing up of blood.No change in color of mucus.No wheezing. Skin:  no rash or lesions. No jaundice GU:  no dysuria, change in color of urine, no urgency or frequency. No straining to urinate.  No flank pain.  Musculoskeletal:  No joint pain or no joint swelling. No decreased range of motion. No back pain.  Psych:  No change in mood or affect. No depression or anxiety. No memory loss.  Neuro: no localizing neurological complaints, no tingling, no weakness,  no double vision, no gait abnormality, no slurred speech, no confusion  Otherwise ROS are negative except for above, 10 systems were reviewed  Past Medical History: Past Medical History  Diagnosis Date  . Kidney stones   . Headache(784.0)   . Nephrolithiasis 02/09/2014  . Glaucoma    History reviewed. No pertinent past surgical history.   Medications: Prior to Admission medications   Medication Sig Start Date End Date Taking? Authorizing Provider  brimonidine-timolol (COMBIGAN) 0.2-0.5 % ophthalmic solution Place 1 drop into both eyes every 12 (twelve) hours.   Yes Historical Provider, MD  brinzolamide (AZOPT) 1 % ophthalmic suspension Place 1 drop into both eyes 2 (two) times daily.   Yes Historical Provider, MD  oxyCODONE-acetaminophen (PERCOCET/ROXICET) 5-325 MG per tablet Take 2 tablets by mouth every 4 (four) hours as needed for severe pain. 02/07/14  Yes Joya Gaskins, MD  promethazine (PHENERGAN) 25 MG tablet Take 1 tablet (25 mg total) by mouth every 6 (six) hours as needed for nausea or vomiting. 02/07/14  Yes Joya Gaskins, MD  Travoprost, BAK Free, (TRAVATAN) 0.004 % SOLN ophthalmic solution Place 1 drop into both eyes at bedtime.   Yes Historical Provider, MD  valACYclovir (VALTREX) 500 MG tablet Take 500 mg by mouth daily.    Yes Historical Provider, MD    Allergies:   Allergies  Allergen Reactions  . Other Other (See Comments)    Perdue chicken: migraines    Social History:  Ambulatory  independently   Lives at home   With family  reports that he has never smoked. He does not have any smokeless tobacco history on file. He reports that he drinks about 1.2 ounces of alcohol per week. He reports that he does not use illicit drugs.    Family History: family history includes Diabetes in his brother; Lung cancer in his mother.    Physical Exam: Patient Vitals for the past 24 hrs:  BP Temp Temp src Pulse Resp SpO2 Height Weight  02/09/14 2310 165/95 mmHg  98.2 F (36.8 C) Oral 73 20 95 %  (1.702 m) 73.1 kg (161 lb 2.5 oz)  02/09/14 2153 124/81 mmHg 98.6 F (37 C) Oral 71 16 96 % - -  02/09/14 1828 147/103 mmHg 98 F (36.7 C) Oral 77 22 100 % - -    1. General:  in No Acute distress 2. Psychological: Alert and  Oriented 3. Head/ENT:     Dry Mucous Membranes                          Head Non traumatic, neck supple                          Normal  Dentition 4. SKIN:  decreased Skin turgor,  Skin clean Dry and intact no rash 5. Heart: Regular rate and rhythm no Murmur, Rub or gallop 6. Lungs: Clear to auscultation bilaterally, no wheezes or crackles   7. Abdomen: Soft, right flank tenderness, Non distended 8. Lower extremities: no clubbing, cyanosis, or edema 9. Neurologically Grossly intact, moving all 4 extremities equally 10. MSK: Normal range of motion  body mass index is 25.23 kg/(m^2).   Labs on Admission:   Recent Labs  02/09/14 2007  NA 139  K 4.4  CL 99  CO2 24  GLUCOSE 103*  BUN 13  CREATININE 1.60*  CALCIUM 10.1   No results found for this basename: AST, ALT, ALKPHOS, BILITOT, PROT, ALBUMIN,  in the last 72 hours No results found for this basename: LIPASE, AMYLASE,  in the last 72 hours  Recent Labs  02/09/14 1910  WBC 7.0  NEUTROABS 5.9  HGB 13.0  HCT 38.6*  MCV 89.4  PLT 125*   No results found for this basename: CKTOTAL, CKMB, CKMBINDEX, TROPONINI,  in the last 72 hours No results found for this basename: TSH, T4TOTAL, FREET3, T3FREE, THYROIDAB,  in the last 72 hours No results found for this basename: VITAMINB12, FOLATE, FERRITIN, TIBC, IRON, RETICCTPCT,  in the last 72 hours No results found for this basename: HGBA1C    Estimated Creatinine Clearance: 58 ml/min (by C-G formula based on Cr of 1.6). ABG No results found for this basename: phart, pco2, po2, hco3, tco2, acidbasedef, o2sat     No results found for this basename: DDIMER     Other results:  UA TNTC RBC many bacteria but no  WBC  BNP (last 3 results) No results found for this basename: PROBNP,  in the last 8760 hours  Filed Weights   02/09/14 2310  Weight: 73.1 kg (161 lb 2.5 oz)     Cultures: No results found for this basename: sdes, specrequest, cult, reptstatus     Radiological Exams on Admission: Ct Abdomen Pelvis Wo Contrast  02/09/2014   CLINICAL DATA:  Right flank pain 1 day. History of kidney stones. Nausea and vomiting.  EXAM: CT ABDOMEN AND PELVIS WITHOUT CONTRAST  TECHNIQUE: Multidetector CT imaging of the abdomen and pelvis  was performed following the standard protocol without IV contrast.  COMPARISON:  12/16/2011, 07/19/2011 and 01/12/2011.  FINDINGS: Lung bases are within normal.  Abdominal images demonstrate the liver, spleen, pancreas, gallbladder and adrenal glands to be within normal. The appendix is retrocecal in location and otherwise normal. There is mild diverticulosis of the colon.  Kidneys are normal in size with multiple bilateral stones. There is a 1 cm cyst over the mid pole of the right kidney. There is minimal dilatation of the right intrarenal collecting system. There is a single stone versus 2 adjacent stones over the right mid ureter measuring 6-7 mm when measured together. Left ureter is normal.  Pelvic images demonstrate a normal bladder, prostate and rectum. There is a tiny amount of free fluid. Remaining bones soft tissues are within normal.  IMPRESSION: Moderate bilateral nephrolithiasis. Single stone versus 2 adjacent stones measuring 6-7 mm when measured together over the right mid ureter causing low-grade obstruction.  1 cm right renal cyst.  Mild diverticulosis of the colon.   Electronically Signed   By: Elberta Fortis M.D.   On: 02/09/2014 19:02    Chart has been reviewed  Assessment/Plan  38 yo M with hx of nephrolithiasis here with evidence of 6 mm Right kidney stone vs 2 stones of smaller caliber   Present on Admission:  . ARF (acute renal failure)- likely due to  dehydration, CT scan showing only low grade obstruction on the right, Will give IV and follow . Nephrolithiasis - Urology is aware, UA showed many bacteria but otherwise no clinical evidence of infection will cover for now with rocephin.  Bacteria in Urine - possibly poor sample, will follow urine cultures continue rocephin  Prophylaxis: SCD , Protonix  CODE STATUS:  FULL CODE   Other plan as per orders.  I have spent a total of 55 min on this admission  Francess Mullen 02/10/2014, 1:46 AM  [facility]  Pager [phone]   If 7AM-7PM, please contact the day team taking care of the patient  [email or web address]  Password [email or web address]

## 2014-02-10 NOTE — Consult Note (Signed)
Urology Consult  Consulting MD: Dr. Adela Glimpse  CC: Kidney stone  HPI: This is a 38 year old male who was transferred from [facility] to [facility] long earlier this morning for management of a right midureteral stone. The patient has a history of passing multiple stones, and has never needed surgical intervention for any of these. Thus far, he has been seen by a [place] [facility] group. The patient went to the emergency room Friday for a left ureteral stone. I passed a day or 2 later. 2 days ago, he began having right flank pain. With the nausea and vomiting, he was unable to keep the nausea medicine or the pain medicine down. There was no fever or chills. The pain was in his right flank, reminiscent of prior kidney stones. It was not associated with fever, chills, gross hematuria or dysuria. He has never had a urinary tract infection before. Because of his pain, as well as bacteria in his urine, he was admitted for further management.  Currently, he is comfortable. He rates his pain a 1/10. He is having no nausea. He is having no fever. He is hungry.  PMH: Past Medical History  Diagnosis Date  . Kidney stones   . Headache(784.0)   . Nephrolithiasis 02/09/2014  . Glaucoma     PSH: History reviewed. No pertinent past surgical history.  Allergies: Allergies  Allergen Reactions  . Other Other (See Comments)    Perdue chicken: migraines    Medications: Prescriptions prior to admission  Medication Sig Dispense Refill  . brimonidine-timolol (COMBIGAN) 0.2-0.5 % ophthalmic solution Place 1 drop into both eyes every 12 (twelve) hours.      . brinzolamide (AZOPT) 1 % ophthalmic suspension Place 1 drop into both eyes 2 (two) times daily.      Marland Kitchen oxyCODONE-acetaminophen (PERCOCET/ROXICET) 5-325 MG per tablet Take 2 tablets by mouth every 4 (four) hours as needed for severe pain.  15 tablet  0  . promethazine (PHENERGAN) 25 MG tablet Take 1 tablet (25 mg total) by mouth every 6 (six) hours  as needed for nausea or vomiting.  30 tablet  0  . Travoprost, BAK Free, (TRAVATAN) 0.004 % SOLN ophthalmic solution Place 1 drop into both eyes at bedtime.      . valACYclovir (VALTREX) 500 MG tablet Take 500 mg by mouth daily.          Social History: History   Social History  . Marital Status: Married    Spouse Name: N/A    Number of Children: N/A  . Years of Education: N/A   Occupational History  . Not on file.   Social History Main Topics  . Smoking status: Never Smoker   . Smokeless tobacco: Not on file  . Alcohol Use: 1.2 oz/week    2 Glasses of wine per week     Comment: social  . Drug Use: No  . Sexual Activity: Yes   Other Topics Concern  . Not on file   Social History Narrative  . No narrative on file    Family History: Family History  Problem Relation Age of Onset  . Lung cancer Mother   . Diabetes Brother     Review of Systems: Positive: Right flank/testicular pain, nausea and vomiting. Negative:  A further 10 point review of systems was negative except what is listed in the HPI.  Physical Exam: @ General: No acute distress.  Awake. Head:  Normocephalic.  Atraumatic. ENT:  EOMI.  Mucous membranes  moist Neck:  Supple.  No lymphadenopathy. CV:  S1 present. S2 present. Regular rate. Pulmonary: Equal effort bilaterally.  Clear to auscultation bilaterally. Abdomen: Soft.  Minimal right CVA and right lower quadrant tenderness. No rebound or guarding. Skin:  Normal turgor.  No visible rash. Extremity: No gross deformity of bilateral upper extremities.  No gross deformity of    bilateral lower extremities. Neurologic: Alert. Appropriate mood.  Penis:  circumcised.  No lesions. Urethra:   Orthotopic meatus. Scrotum: No lesions.  No ecchymosis.  No erythema. Testicles: Descended bilaterally.  No masses bilaterally. Epididymis: Palpable bilaterally.  Non Tender to palpation.  Studies:  Recent Labs     02/09/14  1910  02/10/14  0415  HGB   13.0  13.5  WBC  7.0  7.9  PLT  125*  179    Recent Labs     02/09/14  2007  02/10/14  0415  NA  139  138  K  4.4  4.0  CL  99  100  CO2  24  26  BUN  13  12  CREATININE  1.60*  1.29  CALCIUM  10.1  8.9  GFRNONAA  53*  68*  GFRAA  61*  79*     No results found for this basename: PT, INR, APTT,  in the last 72 hours   No components found with this basename: ABG,   I reviewed the patient's CT images. Additionally, the patient and his wife were able to see these images. There are 2 calculi side-by-side in the right mid/distal ureter. There are multiple bilateral renal calculi. There is mild to moderate right hydroureteronephrosis  Assessment:  1. Right mid ureteral stones. Currently, the patient is relatively asymptomatic i.e. having minimal pain, he is hungry, and he is not having any sequelae. Creatinine is down from 1.6-1.3. 2. Pyuria. I think that this is spurious, as the patient is having no significant signs of UTI  Plan: 1. I think it's okay to let the patient go-he has a urine culture pending 2. I will give the patient Zofran to take in addition to the oxycodone that he has at home 3. I did discuss stone prevention diet with the patient and his wife 4. 35 minutes spent with the patient in direct face-to-face contact 5. The patient can followup with me after discharge      Pager:[phone]

## 2014-02-11 ENCOUNTER — Emergency Department (HOSPITAL_COMMUNITY)
Admission: EM | Admit: 2014-02-11 | Discharge: 2014-02-11 | Discharge status: Against Medical Advice | Payer: PRIVATE HEALTH INSURANCE | Attending: Emergency Medicine | Admitting: Emergency Medicine

## 2014-02-11 DIAGNOSIS — N200 Calculus of kidney: Secondary | ICD-10-CM | POA: Diagnosis not present

## 2014-02-11 DIAGNOSIS — R112 Nausea with vomiting, unspecified: Secondary | ICD-10-CM | POA: Insufficient documentation

## 2014-02-11 DIAGNOSIS — N179 Acute kidney failure, unspecified: Secondary | ICD-10-CM | POA: Diagnosis not present

## 2014-02-11 NOTE — ED Notes (Signed)
While pt waiting for triage, pt went to bathroom and passed 2 stones in strainer.  Pt pain is much improved and has a follow up with md scheduled.  Pt opted to leave instead of stay. Has his meds at home already for condition.

## 2014-02-11 NOTE — Discharge Summary (Signed)
Physician Discharge Summary  Gary Peters:[id number] DOB: 17-Apr-1976 DOA: 02/09/2014  PCP: Almedia Balls, MD  Admit date: 02/09/2014 Discharge date: 02/11/2014  Time spent: 40 minutes  Recommendations for Outpatient Follow-up:  Followup with Dr. Retta Diones in 2 weeks  Discharge Diagnoses:  Active Problems:   AKI (acute kidney injury)   ARF (acute renal failure)   Nephrolithiasis   Nausea and vomiting   Discharge Condition: Stable  Diet recommendation: Regular  Filed Weights   02/09/14 2310  Weight: 73.1 kg (161 lb 2.5 oz)    History of present illness:  Gary Peters is a 38 y.o. male  has a past medical history of Kidney stones; Headache(784.0); Nephrolithiasis (02/09/2014); and Glaucoma.  Presented with  1 week hx of back pain radiating to groin typical for his kidney stone attack. # days ago he has passed a stone but still has sensation of discomfort on the right. CT scan showed possibly 2 stones measuring together as 6 mm vs one stone of that size. Patient reports that the pain is now desended to his right testicle.  He denies any fever or chills. Urology (Dr. Berneice Heinrich) have been consulted and will see patient in AM.  Hospitalist was called for admission for ARF in the setting of dehydration and   Hospital Course:   Nephrolithiasis -Patient presented to the hospital with left flank pain radiates to his left groin. -CT scan of the abdomen/pelvis showed possible 2 stones measuring together about 6 mm versus one stone. -Urology consulted and recommended discharge. -Urology recommended plenty fluid intake, pain and nausea medications. -Patient followup with urology as outpatient  Acute kidney injury -Patient presented with creatinine of 1.6 likely secondary to dehydration from nausea and vomiting. -Creatinine went down to 1.29 the very next day with aggressive IV fluid hydration. -This is resolved.  Nausea and vomiting -Could be secondary to pain versus narcotic pain  medications. -Patient has Phenergan at home and Zofran the hospital worked very well for him, a prescription for Zofran given.  Procedures:  None  Consultations:  Urology  Discharge Exam: Filed Vitals:   02/10/14 0950  BP: 163/107  Pulse: 59  Temp: 97.5 F (36.4 C)  Resp: 18   General: Alert and awake, oriented x3, not in any acute distress. HEENT: anicteric sclera, pupils reactive to light and accommodation, EOMI CVS: S1-S2 clear, no murmur rubs or gallops Chest: clear to auscultation bilaterally, no wheezing, rales or rhonchi Abdomen: soft nontender, nondistended, normal bowel sounds, no organomegaly Extremities: no cyanosis, clubbing or edema noted bilaterally Neuro: Cranial nerves II-XII intact, no focal neurological deficits  Discharge Instructions You were cared for by a hospitalist during your hospital stay. If you have any questions about your discharge medications or the care you received while you were in the hospital after you are discharged, you can call the unit and asked to speak with the hospitalist on call if the hospitalist that took care of you is not available. Once you are discharged, your primary care physician will handle any further medical issues. Please note that NO REFILLS for any discharge medications will be authorized once you are discharged, as it is imperative that you return to your primary care physician (or establish a relationship with a primary care physician if you do not have one) for your aftercare needs so that they can reassess your need for medications and monitor your lab values.  Discharge Instructions   Diet - low sodium heart healthy    Complete by:  As  directed      Increase activity slowly    Complete by:  As directed           Discharge Medication List as of 02/10/2014 11:40 AM    START taking these medications   Details  ondansetron (ZOFRAN) 4 MG tablet Take 1 tablet (4 mg total) by mouth every 8 (eight) hours as needed for  nausea or vomiting., Starting 02/10/2014, Until Discontinued, Print      CONTINUE these medications which have NOT CHANGED   Details  brimonidine-timolol (COMBIGAN) 0.2-0.5 % ophthalmic solution Place 1 drop into both eyes every 12 (twelve) hours., Until Discontinued, Historical Med    brinzolamide (AZOPT) 1 % ophthalmic suspension Place 1 drop into both eyes 2 (two) times daily., Until Discontinued, Historical Med    promethazine (PHENERGAN) 25 MG tablet Take 1 tablet (25 mg total) by mouth every 6 (six) hours as needed for nausea or vomiting., Starting 02/07/2014, Until Discontinued, Print    Travoprost, BAK Free, (TRAVATAN) 0.004 % SOLN ophthalmic solution Place 1 drop into both eyes at bedtime., Until Discontinued, Historical Med    valACYclovir (VALTREX) 500 MG tablet Take 500 mg by mouth daily. , Until Discontinued, Historical Med    oxyCODONE-acetaminophen (PERCOCET/ROXICET) 5-325 MG per tablet Take 2 tablets by mouth every 4 (four) hours as needed for severe pain., Starting 02/07/2014, Until Discontinued, Print       Allergies  Allergen Reactions  . Other Other (See Comments)    Perdue chicken: migraines   Follow-up Information   Follow up with Chelsea Aus, MD. (call for appt)    Specialty:  Urology   Contact information:   [address] [place] [address] [phone]        The results of significant diagnostics from this hospitalization (including imaging, microbiology, ancillary and laboratory) are listed below for reference.    Significant Diagnostic Studies: Ct Abdomen Pelvis Wo Contrast  02/09/2014   CLINICAL DATA:  Right flank pain 1 day. History of kidney stones. Nausea and vomiting.  EXAM: CT ABDOMEN AND PELVIS WITHOUT CONTRAST  TECHNIQUE: Multidetector CT imaging of the abdomen and pelvis was performed following the standard protocol without IV contrast.  COMPARISON:  12/16/2011, 07/19/2011 and 01/12/2011.  FINDINGS: Lung bases are within normal.   Abdominal images demonstrate the liver, spleen, pancreas, gallbladder and adrenal glands to be within normal. The appendix is retrocecal in location and otherwise normal. There is mild diverticulosis of the colon.  Kidneys are normal in size with multiple bilateral stones. There is a 1 cm cyst over the mid pole of the right kidney. There is minimal dilatation of the right intrarenal collecting system. There is a single stone versus 2 adjacent stones over the right mid ureter measuring 6-7 mm when measured together. Left ureter is normal.  Pelvic images demonstrate a normal bladder, prostate and rectum. There is a tiny amount of free fluid. Remaining bones soft tissues are within normal.  IMPRESSION: Moderate bilateral nephrolithiasis. Single stone versus 2 adjacent stones measuring 6-7 mm when measured together over the right mid ureter causing low-grade obstruction.  1 cm right renal cyst.  Mild diverticulosis of the colon.   Electronically Signed   By: Elberta Fortis M.D.   On: 02/09/2014 19:02    Microbiology: Recent Results (from the past 240 hour(s))  URINE CULTURE     Status: None   Collection Time    02/09/14  7:24 PM      Result Value Ref Range  Status   Specimen Description URINE, CLEAN CATCH   Final   Special Requests NONE   Final   Culture  Setup Time     Final   Value: 02/10/2014 02:56     Performed at [facility]   Colony Count     Final   Value: NO GROWTH     Performed at [facility]   Culture     Final   Value: NO GROWTH     Performed at [facility]   Report Status 02/10/2014 FINAL   Final     Labs: Basic Metabolic Panel:  Recent Labs Lab 02/09/14 2007 02/10/14 0415  NA 139 138  K 4.4 4.0  CL 99 100  CO2 24 26  GLUCOSE 103* 111*  BUN 13 12  CREATININE 1.60* 1.29  CALCIUM 10.1 8.9  MG  --  1.8  PHOS  --  3.3   Liver Function Tests:  Recent Labs Lab 02/10/14 0415  AST 18  ALT 12  ALKPHOS 35*  BILITOT 0.4  PROT 7.1  ALBUMIN 3.4*    No results found for this basename: LIPASE, AMYLASE,  in the last 168 hours No results found for this basename: AMMONIA,  in the last 168 hours CBC:  Recent Labs Lab 02/09/14 1910 02/10/14 0415  WBC 7.0 7.9  NEUTROABS 5.9  --   HGB 13.0 13.5  HCT 38.6* 41.2  MCV 89.4 89.8  PLT 125* 179   Cardiac Enzymes: No results found for this basename: CKTOTAL, CKMB, CKMBINDEX, TROPONINI,  in the last 168 hours BNP: BNP (last 3 results) No results found for this basename: PROBNP,  in the last 8760 hours CBG: No results found for this basename: GLUCAP,  in the last 168 hours     Signed:  Juanpablo Ciresi A  [facility] 02/11/2014, 6:37 PM

## 2014-02-12 NOTE — ED Provider Notes (Signed)
Medical screening examination/treatment/procedure(s) were performed by non-physician practitioner and as supervising physician I was immediately available for consultation/collaboration.   EKG Interpretation None        Audree Camel, MD 02/12/14 [phone]

## 2015-10-18 IMAGING — CT CT ABD-PELV W/O CM
2 of 4 series · 16 of 46 positions shown, 18 images · non-contrast
Comparison: 12/16/2011, 07/19/2011 and 01/12/2011.

CLINICAL DATA: Right flank pain 1 day. History of kidney stones.
Nausea and vomiting.

EXAM:
CT ABDOMEN AND PELVIS WITHOUT CONTRAST
TECHNIQUE: Multidetector CT imaging of the abdomen and pelvis was performed
following the standard protocol without IV contrast.

[Series 2: renal stone < 200 lbs 5.0 b31f · axial · 0.72mm/px · z∈[+638,+1078]mm · 13 of 96 slices shown, 15 images]
[im 4/96  soft-tissue]
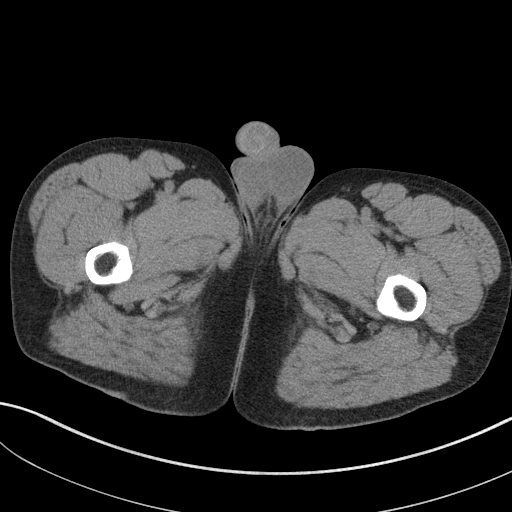
[im 4/96  bone]
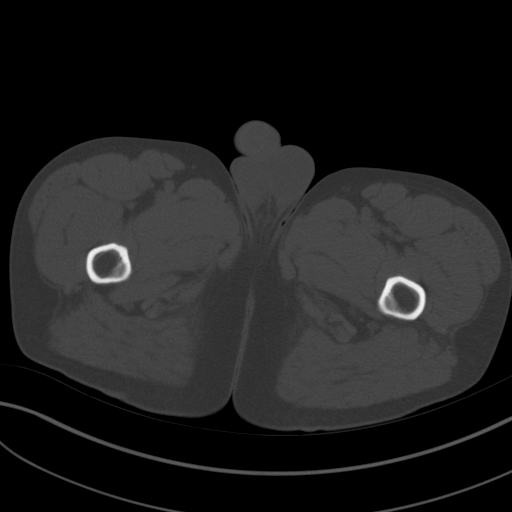
[im 12/96  soft-tissue]
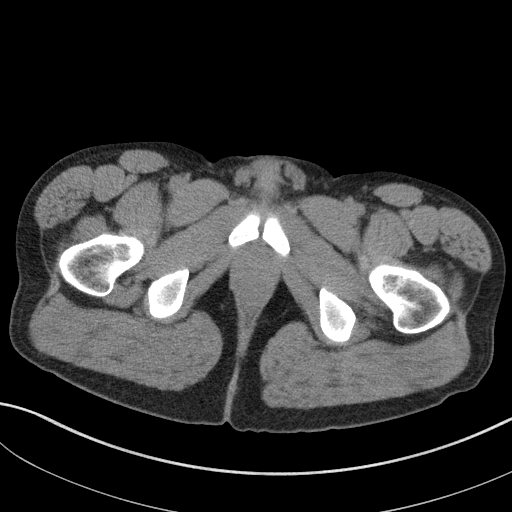
[im 20/96  soft-tissue]
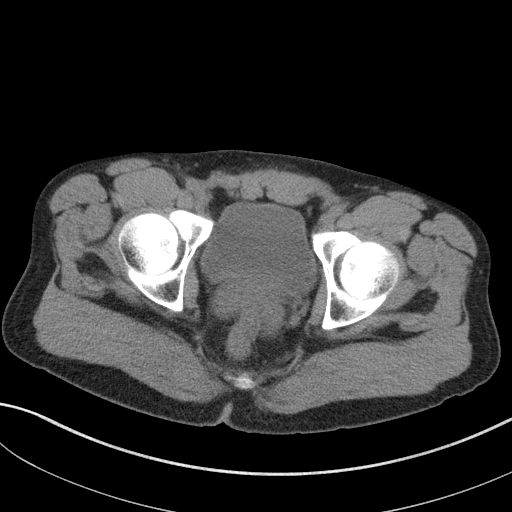
[im 27/96  soft-tissue]
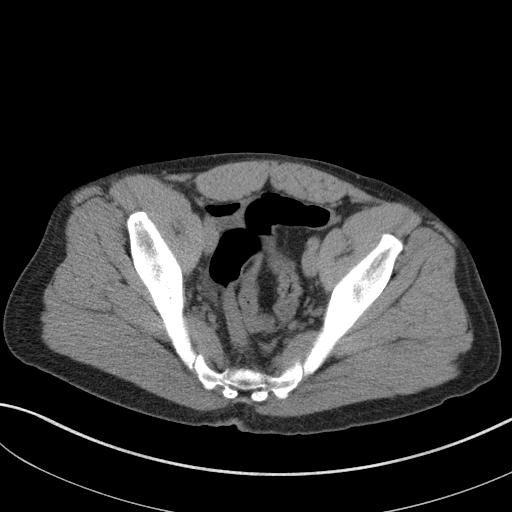
[im 35/96  soft-tissue]
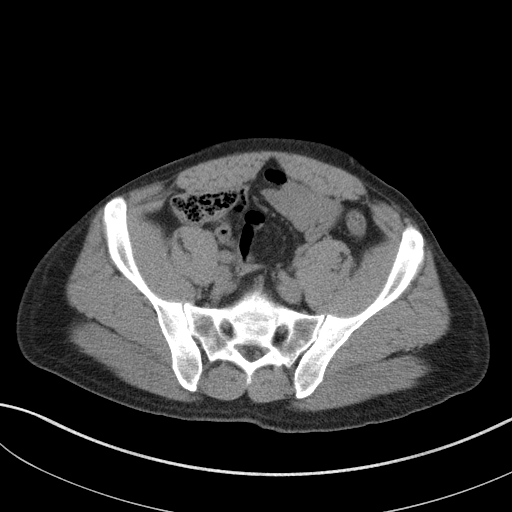
[im 42/96  soft-tissue]
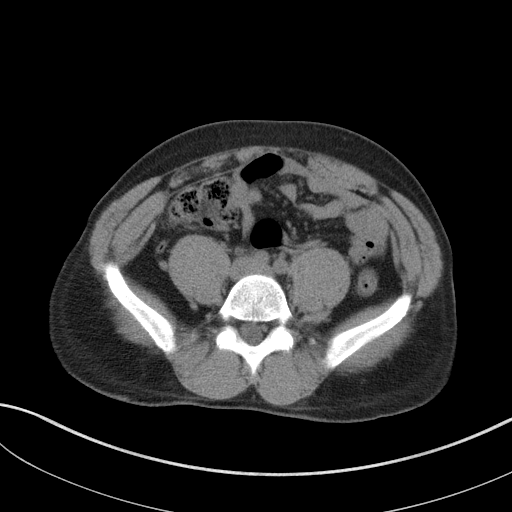
[im 50/96  soft-tissue]
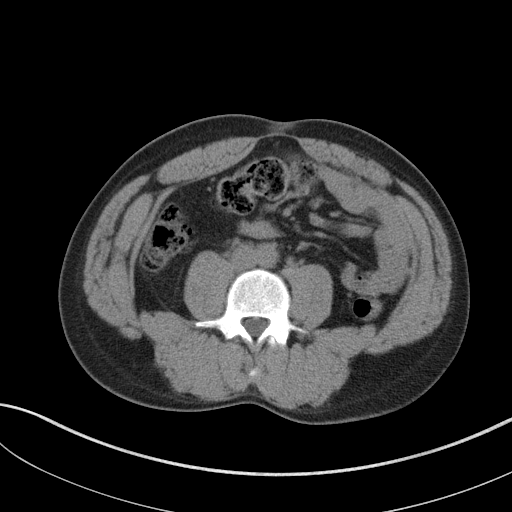
[im 54/96  soft-tissue]
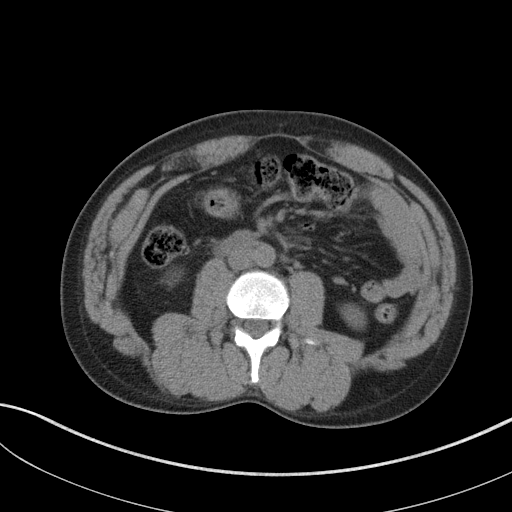
[im 61/96  soft-tissue]
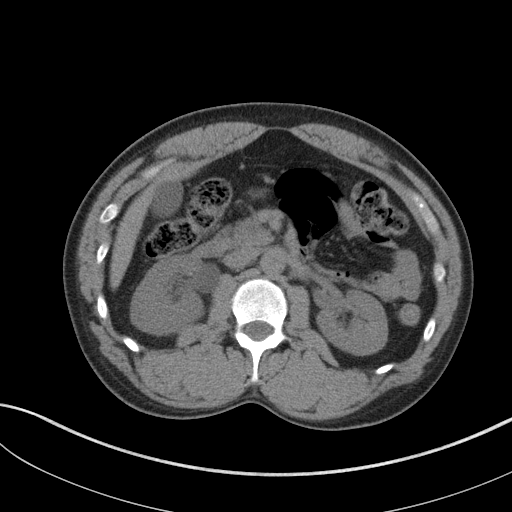
[im 61/96  bone]
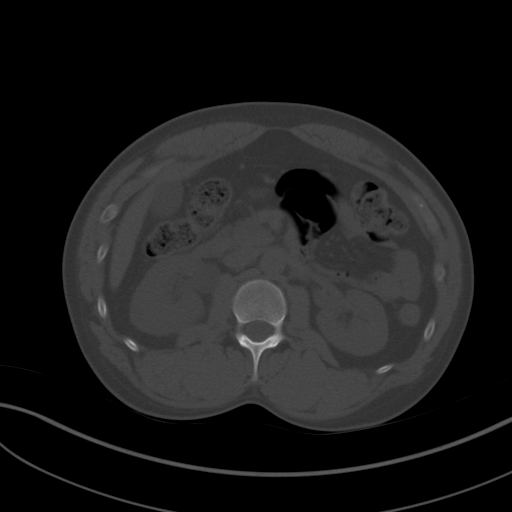
[im 69/96  soft-tissue]
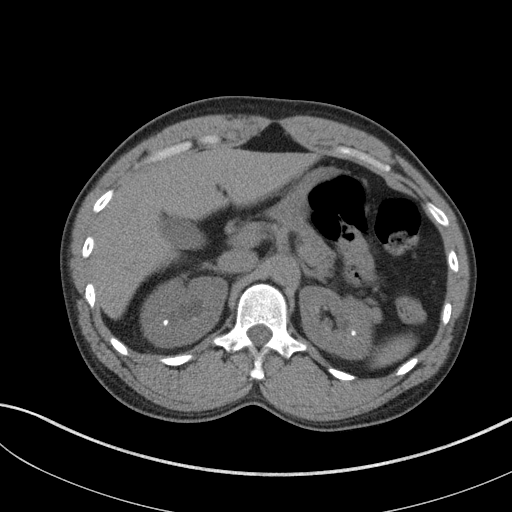
[im 77/96  soft-tissue]
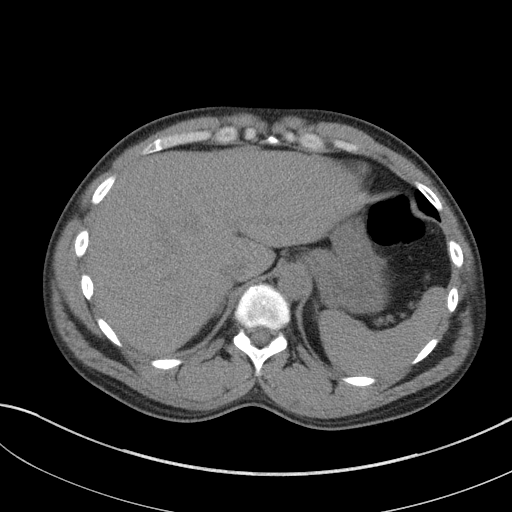
[im 84/96  soft-tissue]
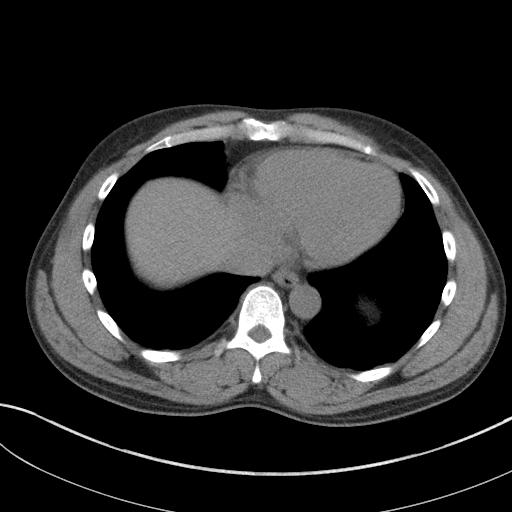
[im 92/96  soft-tissue]
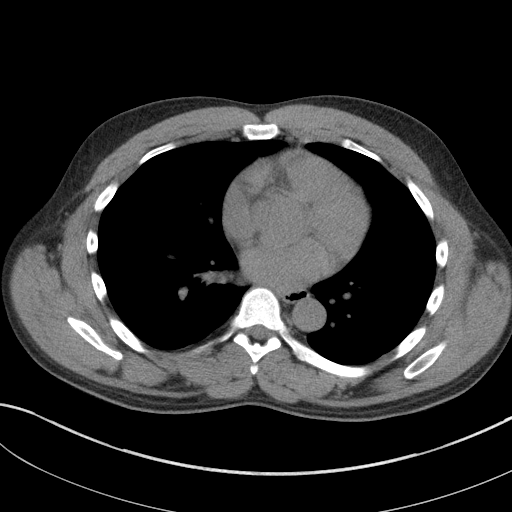

[Series 5: renal stone 3.0 coronal · coronal · 0.70mm/px · 3 of 80 slices shown]
[im 27/80  soft-tissue]
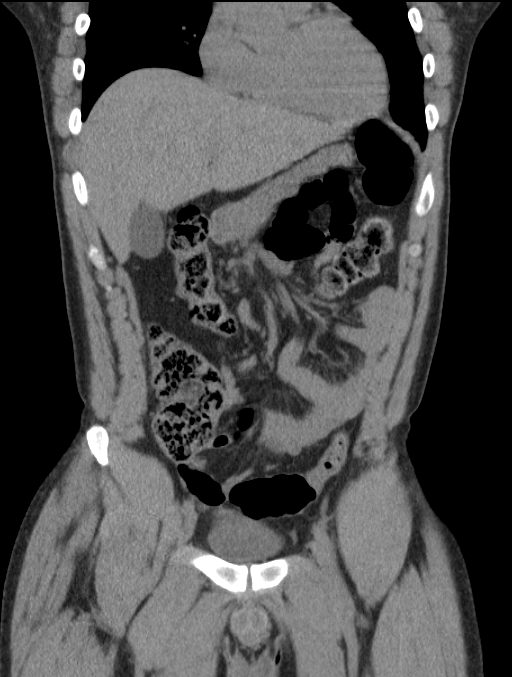
[im 36/80  soft-tissue]
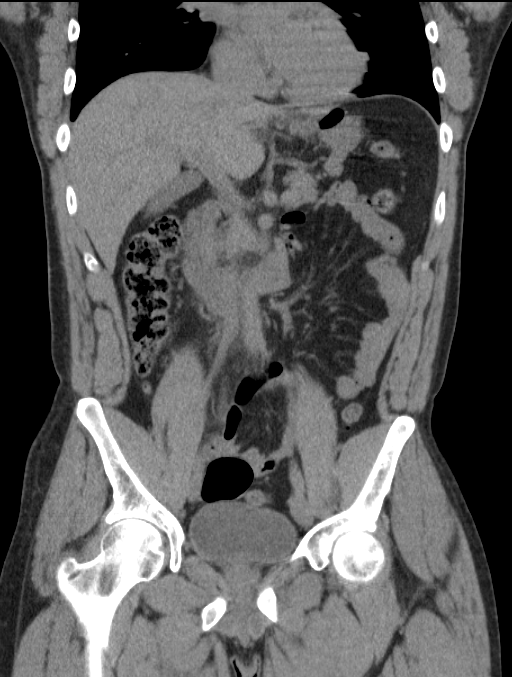
[im 44/80  soft-tissue]
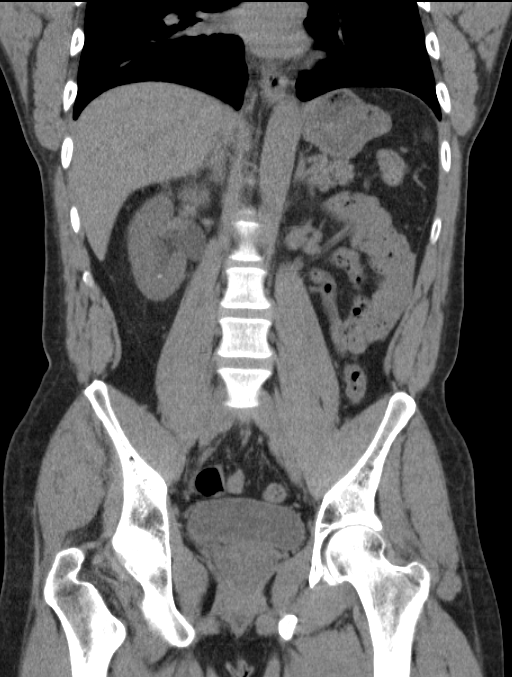

[16 of 46 positions shown; findings below may reference images not displayed]

FINDINGS: Lung bases are within normal.

Abdominal images demonstrate the liver, spleen, pancreas,
gallbladder and adrenal glands to be within normal. The appendix is
retrocecal in location and otherwise normal. There is mild
diverticulosis of the colon.

Kidneys are normal in size with multiple bilateral stones. There is
a 1 cm cyst over the mid pole of the right kidney. There is minimal
dilatation of the right intrarenal collecting system. There is a
single stone versus 2 adjacent stones over the right mid ureter
measuring 6-7 mm when measured together. Left ureter is normal.

Pelvic images demonstrate a normal bladder, prostate and rectum.
There is a tiny amount of free fluid. Remaining bones soft tissues
are within normal.
IMPRESSION: Moderate bilateral nephrolithiasis. Single stone versus 2 adjacent
stones measuring 6-7 mm when measured together over the right mid
ureter causing low-grade obstruction.

1 cm right renal cyst.

Mild diverticulosis of the colon.

## 2017-04-18 ENCOUNTER — Encounter (HOSPITAL_BASED_OUTPATIENT_CLINIC_OR_DEPARTMENT_OTHER): Payer: Self-pay | Admitting: Emergency Medicine

## 2017-04-18 ENCOUNTER — Other Ambulatory Visit: Payer: Self-pay

## 2017-04-18 ENCOUNTER — Emergency Department (HOSPITAL_BASED_OUTPATIENT_CLINIC_OR_DEPARTMENT_OTHER)
Admission: EM | Admit: 2017-04-18 | Discharge: 2017-04-18 | Disposition: A | Discharge status: Home / Self Care | Payer: 59 | Attending: Physician Assistant | Admitting: Physician Assistant

## 2017-04-18 ENCOUNTER — Emergency Department (HOSPITAL_BASED_OUTPATIENT_CLINIC_OR_DEPARTMENT_OTHER): Payer: 59

## 2017-04-18 DIAGNOSIS — R1032 Left lower quadrant pain: Secondary | ICD-10-CM | POA: Diagnosis present

## 2017-04-18 DIAGNOSIS — Z79899 Other long term (current) drug therapy: Secondary | ICD-10-CM | POA: Diagnosis not present

## 2017-04-18 DIAGNOSIS — R1110 Vomiting, unspecified: Secondary | ICD-10-CM | POA: Diagnosis not present

## 2017-04-18 DIAGNOSIS — N200 Calculus of kidney: Secondary | ICD-10-CM | POA: Insufficient documentation

## 2017-04-18 LAB — URINALYSIS, ROUTINE W REFLEX MICROSCOPIC
Bilirubin Urine: NEGATIVE
Glucose, UA: NEGATIVE mg/dL
Ketones, ur: NEGATIVE mg/dL
Leukocytes, UA: NEGATIVE
Nitrite: NEGATIVE
Protein, ur: NEGATIVE mg/dL
Specific Gravity, Urine: 1.015 (ref 1.005–1.030)
pH: 7 (ref 5.0–8.0)

## 2017-04-18 LAB — CBC
HCT: 44.1 % (ref 39.0–52.0)
Hemoglobin: 14.6 g/dL (ref 13.0–17.0)
MCH: 29.2 pg (ref 26.0–34.0)
MCHC: 33.1 g/dL (ref 30.0–36.0)
MCV: 88.2 fL (ref 78.0–100.0)
Platelets: 193 K/uL (ref 150–400)
RBC: 5 MIL/uL (ref 4.22–5.81)
RDW: 11.8 % (ref 11.5–15.5)
WBC: 4.5 K/uL (ref 4.0–10.5)

## 2017-04-18 LAB — BASIC METABOLIC PANEL
ANION GAP: 7 (ref 5–15)
BUN: 13 mg/dL (ref 6–20)
CALCIUM: 9.5 mg/dL (ref 8.9–10.3)
CO2: 27 mmol/L (ref 22–32)
Chloride: 102 mmol/L (ref 101–111)
Creatinine, Ser: 1.19 mg/dL (ref 0.61–1.24)
GFR calc Af Amer: >60 mL/min (ref >60)
Glucose, Bld: 129 mg/dL — ABNORMAL HIGH (ref 65–99)
POTASSIUM: 4.7 mmol/L (ref 3.5–5.1)
SODIUM: 136 mmol/L (ref 135–145)

## 2017-04-18 LAB — URINALYSIS, MICROSCOPIC (REFLEX)

## 2017-04-18 MED ORDER — ONDANSETRON HCL 4 MG/2ML IJ SOLN
4.0000 mg | Freq: Once | INTRAMUSCULAR | Status: AC | PRN
  Administered 2017-04-18: 4 mg via INTRAVENOUS

## 2017-04-18 MED ORDER — KETOROLAC TROMETHAMINE 30 MG/ML IJ SOLN
30.0000 mg | Freq: Once | INTRAMUSCULAR | Status: AC
  Administered 2017-04-18: 30 mg via INTRAVENOUS
  Filled 2017-04-18: qty 1

## 2017-04-18 MED ORDER — FENTANYL CITRATE (PF) 100 MCG/2ML IJ SOLN
50.0000 ug | Freq: Once | INTRAMUSCULAR | Status: AC
  Administered 2017-04-18: 50 ug via INTRAVENOUS
  Filled 2017-04-18: qty 2

## 2017-04-18 MED ORDER — SODIUM CHLORIDE 0.9 % IV BOLUS (SEPSIS)
1000.0000 mL | Freq: Once | INTRAVENOUS | Status: AC
  Administered 2017-04-18: 1000 mL via INTRAVENOUS

## 2017-04-18 MED ORDER — ONDANSETRON 4 MG PO TBDP: 4.0000 mg | ORAL_TABLET | Freq: Three times a day (TID) | ORAL | 0 refills | Status: AC | PRN

## 2017-04-18 MED ORDER — ONDANSETRON HCL 4 MG/2ML IJ SOLN
4.0000 mg | Freq: Once | INTRAMUSCULAR | Status: DC
  Filled 2017-04-18: qty 2

## 2017-04-18 MED ORDER — OXYCODONE-ACETAMINOPHEN 5-325 MG PO TABS
1.0000 | ORAL_TABLET | Freq: Once | ORAL | Status: AC
  Administered 2017-04-18: 1 via ORAL
  Filled 2017-04-18 (×2): qty 1

## 2017-04-18 MED ORDER — ONDANSETRON HCL 4 MG/2ML IJ SOLN
4.0000 mg | Freq: Once | INTRAMUSCULAR | Status: AC
  Administered 2017-04-18: 4 mg via INTRAVENOUS
  Filled 2017-04-18: qty 2

## 2017-04-18 MED ORDER — TAMSULOSIN HCL 0.4 MG PO CAPS: 0.4000 mg | ORAL_CAPSULE | Freq: Every day | ORAL | 0 refills | Status: AC

## 2017-04-18 MED ORDER — OXYCODONE-ACETAMINOPHEN 5-325 MG PO TABS: 1.0000 | ORAL_TABLET | Freq: Four times a day (QID) | ORAL | 0 refills | Status: AC | PRN

## 2017-04-18 MED ORDER — SODIUM CHLORIDE 0.9 % IV BOLUS (SEPSIS)
1000.0000 mL | Freq: Once | INTRAVENOUS | Status: AC
Start: 2017-04-18 — End: 2017-04-18
  Administered 2017-04-18: 1000 mL via INTRAVENOUS

## 2017-04-18 MED FILL — ONDANSETRON ODT 4 MG TABLET: 4 | 6 days supply | Qty: 20 | Fill #0

## 2017-04-18 MED FILL — OXYCODONE-ACETAMINOPHEN 5-3: 5-325 | 2 days supply | Qty: 11 | Fill #0

## 2017-04-18 MED FILL — TAMSULOSIN HCL 0.4 MG CAP: 0.4 | 30 days supply | Qty: 30 | Fill #0

## 2017-04-18 NOTE — ED Provider Notes (Signed)
[facility] EMERGENCY DEPARTMENT Provider Note   CSN: [id number] Arrival date & time: 04/18/17  0935     History   Chief Complaint Chief Complaint  Patient presents with  . Flank Pain    HPI Gary Peters is a 41 y.o. male.  HPI  Patient is a 41 year old male presenting with left flank pain rating to his left groin.  This started at 4 AM.  Patient has history of stones.  Feels like it the same.  Patient was actively vomiting and writhing around in pain on arrival.  Patient has not noted any fevers.    Past Medical History:  Diagnosis Date  . Glaucoma   . Headache(784.0)   . Kidney stones   . Nephrolithiasis 02/09/2014    Patient Active Problem List   Diagnosis Date Noted  . Nausea and vomiting 02/10/2014  . AKI (acute kidney injury) (HCC) 02/09/2014  . ARF (acute renal failure) (HCC) 02/09/2014  . Nephrolithiasis 02/09/2014    History reviewed. No pertinent surgical history.     Home Medications    Prior to Admission medications   Medication Sig Start Date End Date Taking? Authorizing Provider  brimonidine-timolol (COMBIGAN) 0.2-0.5 % ophthalmic solution Place 1 drop into both eyes every 12 (twelve) hours.    [provider]  brinzolamide (AZOPT) 1 % ophthalmic suspension Place 1 drop into both eyes 2 (two) times daily.    [provider]  ondansetron (ZOFRAN ODT) 4 MG disintegrating tablet Take 1 tablet (4 mg total) every 8 (eight) hours as needed by mouth for nausea or vomiting. 04/18/17   Brendan Gadson Lyn, MD  ondansetron (ZOFRAN) 4 MG tablet Take 1 tablet (4 mg total) by mouth every 8 (eight) hours as needed for nausea or vomiting. 02/10/14   Clydia LlanoElmahi, Mutaz, MD  oxyCODONE-acetaminophen (PERCOCET/ROXICET) 5-325 MG per tablet Take 1 tablet by mouth every 6 (six) hours as needed for severe pain. 02/10/14   Clydia LlanoElmahi, Mutaz, MD  oxyCODONE-acetaminophen (PERCOCET/ROXICET) 5-325 MG tablet Take 1 tablet every 6 (six) hours as needed by  mouth for severe pain. 04/18/17   Iverson Sees Lyn, MD  promethazine (PHENERGAN) 25 MG tablet Take 1 tablet (25 mg total) by mouth every 6 (six) hours as needed for nausea or vomiting. 02/07/14   Zadie RhineWickline, Donald, MD  tamsulosin (FLOMAX) 0.4 MG CAPS capsule Take 1 capsule (0.4 mg total) daily by mouth. 04/18/17   Champagne Paletta Lyn, MD  Travoprost, BAK Free, (TRAVATAN) 0.004 % SOLN ophthalmic solution Place 1 drop into both eyes at bedtime.    [provider]  valACYclovir (VALTREX) 500 MG tablet Take 500 mg by mouth daily.     [provider]    Family History Family History  Problem Relation Age of Onset  . Lung cancer Mother   . Diabetes Brother     Social History Social History   Tobacco Use  . Smoking status: Never Smoker  Substance Use Topics  . Alcohol use: Yes    Alcohol/week: 1.2 oz    Types: 2 Glasses of wine per week    Comment: social  . Drug use: No     Allergies   Other   Review of Systems Review of Systems  Constitutional: Negative for activity change, fatigue and fever.  Respiratory: Negative for shortness of breath.   Cardiovascular: Negative for chest pain.  Gastrointestinal: Negative for abdominal pain.  Genitourinary: Positive for flank pain and testicular pain.     Physical Exam Updated Vital Signs  BP (!) 134/93 (BP Location: Left Arm)   Pulse 82   Temp 98.4 F (36.9 C) (Oral)   Resp 16   SpO2 97%   Physical Exam  Constitutional: He is oriented to person, place, and time. He appears well-nourished.  Grimacing, writhing around in pain.  HENT:  Head: Normocephalic.  Eyes: Conjunctivae are normal.  Cardiovascular: Normal rate and regular rhythm.  Pulmonary/Chest: Effort normal and breath sounds normal. No respiratory distress.  Abdominal: Soft. He exhibits no distension. There is no tenderness.  Neurological: He is oriented to person, place, and time.  Skin: Skin is warm and dry. He is not diaphoretic.    Psychiatric: He has a normal mood and affect. His behavior is normal.     ED Treatments / Results  Labs (all labs ordered are listed, but only abnormal results are displayed) Labs Reviewed  URINALYSIS, ROUTINE W REFLEX MICROSCOPIC - Abnormal; Notable for the following components:      Result Value   Color, Urine BROWN (*)    APPearance CLOUDY (*)    Hgb urine dipstick LARGE (*)    All other components within normal limits  BASIC METABOLIC PANEL - Abnormal; Notable for the following components:   Glucose, Bld 129 (*)    All other components within normal limits  URINALYSIS, MICROSCOPIC (REFLEX) - Abnormal; Notable for the following components:   Bacteria, UA FEW (*)    Squamous Epithelial / LPF 0-5 (*)    All other components within normal limits  CBC    EKG  EKG Interpretation None       Radiology Ct Renal Stone Study  Result Date: 04/18/2017 CLINICAL DATA:  Left flank pain. EXAM: CT ABDOMEN AND PELVIS WITHOUT CONTRAST TECHNIQUE: Multidetector CT imaging of the abdomen and pelvis was performed following the standard protocol without IV contrast. COMPARISON:  02/09/2014 FINDINGS: Lower chest: Right basilar dependent atelectasis or scarring. No effusions. Hepatobiliary: No focal hepatic abnormality. Gallbladder unremarkable. Pancreas: No focal abnormality or ductal dilatation. Spleen: No focal abnormality.  Normal size. Adrenals/Urinary Tract: Numerous bilateral nonobstructing renal stones. There is mild left hydronephrosis due to 4 mm mid left ureteral stone. No hydronephrosis on the right. 1.8 cm low-density area in the midpole of the right kidney, likely cysts. Adrenal glands and urinary bladder are unremarkable. Stomach/Bowel: Few scattered sigmoid diverticula. Moderate stool burden throughout the colon. Stomach and small bowel decompressed, unremarkable. Appendix is normal. Vascular/Lymphatic: No evidence of aneurysm or adenopathy. Reproductive: No visible focal abnormality.  Other: No free fluid or free air. Musculoskeletal: No acute bony abnormality. IMPRESSION: Bilateral nephrolithiasis with numerous bilateral renal stones. 4 mm mid left ureteral stone with mild left hydronephrosis. Electronically Signed   By: Charlett Nose M.D.   On: 04/18/2017 10:43    Procedures Procedures (including critical care time)  Medications Ordered in ED Medications  ondansetron (ZOFRAN) injection 4 mg (4 mg Intravenous Given 04/18/17 0958)  sodium chloride 0.9 % bolus 1,000 mL (0 mLs Intravenous Stopped 04/18/17 1123)  fentaNYL (SUBLIMAZE) injection 50 mcg (50 mcg Intravenous Given 04/18/17 1018)  ketorolac (TORADOL) 30 MG/ML injection 30 mg (30 mg Intravenous Given 04/18/17 1141)  ondansetron (ZOFRAN) injection 4 mg (4 mg Intravenous Given 04/18/17 1143)  sodium chloride 0.9 % bolus 1,000 mL (1,000 mLs Intravenous New Bag/Given 04/18/17 1233)  oxyCODONE-acetaminophen (PERCOCET/ROXICET) 5-325 MG per tablet 1 tablet (1 tablet Oral Given 04/18/17 1318)     Initial Impression / Assessment and Plan / ED Course  I have reviewed the triage vital  signs and the nursing notes.  Pertinent labs & imaging results that were available during my care of the patient were reviewed by me and considered in my medical decision making (see chart for details).    Patient is a 41 year old male presenting with left flank pain rating to his left groin.  This started at 4 AM.  Patient has history of stones.  Feels like it the same.  Patient was actively vomiting and writhing around in pain on arrival.  Patient has not noted any fevers.  10:22 AM Will initiate kidney stone workup including urine, CT stone.  Patient tolerated p.o.  Tolerated p.o. pain medications.  Has not vomited recently.  Was given 2 L of fluid.  Will have him return to follow-up with his urologist.  Final Clinical Impressions(s) / ED Diagnoses   Final diagnoses:  Kidney stone    ED Discharge Orders        Ordered     oxyCODONE-acetaminophen (PERCOCET/ROXICET) 5-325 MG tablet  Every 6 hours PRN     04/18/17 1509    tamsulosin (FLOMAX) 0.4 MG CAPS capsule  Daily     04/18/17 1509    ondansetron (ZOFRAN ODT) 4 MG disintegrating tablet  Every 8 hours PRN     04/18/17 1509       Dreya Buhrman Lyn, MD 04/18/17 1511

## 2017-04-18 NOTE — ED Triage Notes (Signed)
Left sided flank pain since 4 am.  Pt history of kidney stones.  Pt actively vomiting due to pain.

## 2017-04-18 NOTE — ED Notes (Signed)
Pt educated about not driving or performing other critical tasks (such as operating heavy machinery, caring for infant/toddler/child) due to sedative nature of medications received in ED. Also warned about risks of consuming alcohol or taking other medications with sedative properties. Pt/caregiver verbalized understanding.  

## 2017-04-18 NOTE — ED Notes (Signed)
ED Provider at bedside. 

## 2017-04-18 NOTE — ED Notes (Signed)
Discharge instructions and prescriptions given to patient with wife at bedside.  Verbalized understanding.

## 2017-04-18 NOTE — Discharge Instructions (Signed)
Please return with any fever, or other concerns.  Otherwise please follow-up with your urologist.

## 2017-06-04 ENCOUNTER — Emergency Department (HOSPITAL_BASED_OUTPATIENT_CLINIC_OR_DEPARTMENT_OTHER)
Admission: EM | Admit: 2017-06-04 | Discharge: 2017-06-04 | Disposition: A | Discharge status: Home / Self Care | Payer: 59 | Attending: Emergency Medicine | Admitting: Emergency Medicine

## 2017-06-04 ENCOUNTER — Emergency Department (HOSPITAL_BASED_OUTPATIENT_CLINIC_OR_DEPARTMENT_OTHER): Payer: 59

## 2017-06-04 ENCOUNTER — Encounter (HOSPITAL_BASED_OUTPATIENT_CLINIC_OR_DEPARTMENT_OTHER): Payer: Self-pay | Admitting: *Deleted

## 2017-06-04 ENCOUNTER — Other Ambulatory Visit: Payer: Self-pay

## 2017-06-04 DIAGNOSIS — Z79899 Other long term (current) drug therapy: Secondary | ICD-10-CM | POA: Insufficient documentation

## 2017-06-04 DIAGNOSIS — R319 Hematuria, unspecified: Secondary | ICD-10-CM | POA: Insufficient documentation

## 2017-06-04 DIAGNOSIS — M791 Myalgia, unspecified site: Secondary | ICD-10-CM | POA: Diagnosis present

## 2017-06-04 DIAGNOSIS — Z87442 Personal history of urinary calculi: Secondary | ICD-10-CM | POA: Insufficient documentation

## 2017-06-04 LAB — URINALYSIS, MICROSCOPIC (REFLEX)

## 2017-06-04 LAB — URINALYSIS, ROUTINE W REFLEX MICROSCOPIC
Bilirubin Urine: NEGATIVE
GLUCOSE, UA: NEGATIVE mg/dL
Ketones, ur: NEGATIVE mg/dL
Leukocytes, UA: NEGATIVE
Nitrite: NEGATIVE
Protein, ur: NEGATIVE mg/dL
Specific Gravity, Urine: >1.03 — ABNORMAL HIGH (ref 1.005–1.030)
pH: 5.5 (ref 5.0–8.0)

## 2017-06-04 MED ORDER — KETOROLAC TROMETHAMINE 30 MG/ML IJ SOLN
15.0000 mg | Freq: Once | INTRAMUSCULAR | Status: AC
  Administered 2017-06-04: 15 mg via INTRAVENOUS
  Filled 2017-06-04: qty 1

## 2017-06-04 MED ORDER — DICLOFENAC SODIUM ER 100 MG PO TB24: 100.0000 mg | ORAL_TABLET | Freq: Every day | ORAL | 0 refills | Status: AC

## 2017-06-04 NOTE — ED Provider Notes (Signed)
[facility] EMERGENCY DEPARTMENT Provider Note   CSN: [id number] Arrival date & time: 06/04/17  0153     History   Chief Complaint Chief Complaint  Patient presents with  . Flank Pain    HPI Gary Peters is a 41 y.o. male.  The history is provided by the patient.  Flank Pain  This is a recurrent problem. The current episode started more than 2 days ago (5 days). The problem occurs constantly. The problem has not changed since onset.Pertinent negatives include no chest pain, no abdominal pain, no headaches and no shortness of breath. Nothing aggravates the symptoms. Nothing relieves the symptoms. He has tried nothing for the symptoms. The treatment provided no relief.  No n/v/d.  No f/c/r.    Past Medical History:  Diagnosis Date  . Glaucoma   . Headache(784.0)   . Kidney stones   . Nephrolithiasis 02/09/2014    Patient Active Problem List   Diagnosis Date Noted  . Nausea and vomiting 02/10/2014  . AKI (acute kidney injury) (HCC) 02/09/2014  . ARF (acute renal failure) (HCC) 02/09/2014  . Nephrolithiasis 02/09/2014    History reviewed. No pertinent surgical history.     Home Medications    Prior to Admission medications   Medication Sig Start Date End Date Taking? Authorizing Provider  brimonidine-timolol (COMBIGAN) 0.2-0.5 % ophthalmic solution Place 1 drop into both eyes every 12 (twelve) hours.    [provider]  brinzolamide (AZOPT) 1 % ophthalmic suspension Place 1 drop into both eyes 2 (two) times daily.    [provider]  Diclofenac Sodium CR (VOLTAREN-XR) 100 MG 24 hr tablet Take 1 tablet (100 mg total) by mouth daily. 06/04/17   Dolton Shaker, MD  ondansetron (ZOFRAN ODT) 4 MG disintegrating tablet Take 1 tablet (4 mg total) every 8 (eight) hours as needed by mouth for nausea or vomiting. 04/18/17   Mackuen, Courteney Lyn, MD  ondansetron (ZOFRAN) 4 MG tablet Take 1 tablet (4 mg total) by mouth every 8 (eight) hours as  needed for nausea or vomiting. 02/10/14   Clydia LlanoElmahi, Mutaz, MD  oxyCODONE-acetaminophen (PERCOCET/ROXICET) 5-325 MG per tablet Take 1 tablet by mouth every 6 (six) hours as needed for severe pain. 02/10/14   Clydia LlanoElmahi, Mutaz, MD  oxyCODONE-acetaminophen (PERCOCET/ROXICET) 5-325 MG tablet Take 1 tablet every 6 (six) hours as needed by mouth for severe pain. 04/18/17   Mackuen, Courteney Lyn, MD  promethazine (PHENERGAN) 25 MG tablet Take 1 tablet (25 mg total) by mouth every 6 (six) hours as needed for nausea or vomiting. 02/07/14   Zadie RhineWickline, Donald, MD  tamsulosin (FLOMAX) 0.4 MG CAPS capsule Take 1 capsule (0.4 mg total) daily by mouth. 04/18/17   Mackuen, Courteney Lyn, MD  Travoprost, BAK Free, (TRAVATAN) 0.004 % SOLN ophthalmic solution Place 1 drop into both eyes at bedtime.    [provider]  valACYclovir (VALTREX) 500 MG tablet Take 500 mg by mouth daily.     [provider]    Family History Family History  Problem Relation Age of Onset  . Lung cancer Mother   . Diabetes Brother     Social History Social History   Tobacco Use  . Smoking status: Never Smoker  . Smokeless tobacco: Never Used  Substance Use Topics  . Alcohol use: Yes    Alcohol/week: 1.2 oz    Types: 2 Glasses of wine per week    Comment: social  . Drug use: No     Allergies  Other   Review of Systems Review of Systems  Constitutional: Negative for fever.  Respiratory: Negative for shortness of breath.   Cardiovascular: Negative for chest pain.  Gastrointestinal: Negative for abdominal pain, nausea and vomiting.  Genitourinary: Positive for flank pain. Negative for decreased urine volume, discharge, dysuria, frequency, genital sores, hematuria, penile pain, penile swelling, scrotal swelling, testicular pain and urgency.  Neurological: Negative for headaches.  All other systems reviewed and are negative.    Physical Exam Updated Vital Signs BP (!) 140/98 (BP Location: Left Arm)   Pulse 70    Temp 97.8 F (36.6 C) (Oral)   Resp 18   SpO2 98%   Physical Exam  Constitutional: He is oriented to person, place, and time. He appears well-developed and well-nourished.  HENT:  Head: Normocephalic and atraumatic.  Mouth/Throat: No oropharyngeal exudate.  Eyes: Conjunctivae are normal. Pupils are equal, round, and reactive to light.  Neck: Normal range of motion. Neck supple.  Cardiovascular: Normal rate, regular rhythm, normal heart sounds and intact distal pulses.  Pulmonary/Chest: Effort normal and breath sounds normal. No stridor. He has no wheezes. He has no rales.  Abdominal: Soft. Bowel sounds are normal. He exhibits no mass. There is no tenderness. There is no rebound and no guarding. No hernia.  Musculoskeletal: Normal range of motion.  Neurological: He is alert and oriented to person, place, and time. He displays normal reflexes.  Skin: Skin is warm and dry. Capillary refill takes less than 2 seconds.  Psychiatric: He has a normal mood and affect.  Nursing note and vitals reviewed.    ED Treatments / Results  Labs (all labs ordered are listed, but only abnormal results are displayed)  Results for orders placed or performed during the hospital encounter of 06/04/17  Urinalysis, Routine w reflex microscopic  Result Value Ref Range   Color, Urine YELLOW YELLOW   APPearance CLEAR CLEAR   Specific Gravity, Urine >1.030 (H) 1.005 - 1.030   pH 5.5 5.0 - 8.0   Glucose, UA NEGATIVE NEGATIVE mg/dL   Hgb urine dipstick TRACE (A) NEGATIVE   Bilirubin Urine NEGATIVE NEGATIVE   Ketones, ur NEGATIVE NEGATIVE mg/dL   Protein, ur NEGATIVE NEGATIVE mg/dL   Nitrite NEGATIVE NEGATIVE   Leukocytes, UA NEGATIVE NEGATIVE  Urinalysis, Microscopic (reflex)  Result Value Ref Range   RBC / HPF 0-5 0 - 5 RBC/hpf   WBC, UA 0-5 0 - 5 WBC/hpf   Bacteria, UA RARE (A) NONE SEEN   Squamous Epithelial / LPF 0-5 (A) NONE SEEN   Mucus PRESENT    Ct Renal Stone Study  Result Date:  06/04/2017 CLINICAL DATA:  Acute onset of left flank pain.  Hematuria. EXAM: CT ABDOMEN AND PELVIS WITHOUT CONTRAST TECHNIQUE: Multidetector CT imaging of the abdomen and pelvis was performed following the standard protocol without IV contrast. COMPARISON:  CT of the head performed 04/18/2017 FINDINGS: Lower chest: The visualized lung bases are grossly clear. The visualized portions of the mediastinum are unremarkable. Hepatobiliary: The liver is unremarkable in appearance. The gallbladder is unremarkable in appearance. The common bile duct remains normal in caliber. Pancreas: The pancreas is within normal limits. Spleen: The spleen is unremarkable in appearance. Adrenals/Urinary Tract: The adrenal glands are unremarkable in appearance. Nonobstructing bilateral renal stones measure up to 5 mm in size. There is no evidence of hydronephrosis. No obstructing ureteral stones are identified. No perinephric stranding is appreciated. Stomach/Bowel: The stomach is unremarkable in appearance. The small bowel is within normal limits. The  appendix is normal in caliber, without evidence of appendicitis. Minimal diverticulosis is noted along the distal descending and proximal sigmoid colon, without evidence of diverticulitis. Vascular/Lymphatic: The abdominal aorta is unremarkable in appearance. The inferior vena cava is grossly unremarkable. No retroperitoneal lymphadenopathy is seen. No pelvic sidewall lymphadenopathy is identified. Reproductive: The bladder is mildly distended and grossly unremarkable. The prostate is borderline normal in size. Other: No additional soft tissue abnormalities are seen. Musculoskeletal: No acute osseous abnormalities are identified. The visualized musculature is unremarkable in appearance. IMPRESSION: 1. No evidence of hydronephrosis. No obstructing ureteral stone seen. 2. Nonobstructing bilateral renal stones measure up to 5 mm in size. 3. Minimal diverticulosis along the distal descending and  proximal sigmoid colon, without evidence of diverticulitis. Electronically Signed   By: Roanna Raider M.D.   On: 06/04/2017 03:40    Radiology Ct Renal Stone Study  Result Date: 06/04/2017 CLINICAL DATA:  Acute onset of left flank pain.  Hematuria. EXAM: CT ABDOMEN AND PELVIS WITHOUT CONTRAST TECHNIQUE: Multidetector CT imaging of the abdomen and pelvis was performed following the standard protocol without IV contrast. COMPARISON:  CT of the head performed 04/18/2017 FINDINGS: Lower chest: The visualized lung bases are grossly clear. The visualized portions of the mediastinum are unremarkable. Hepatobiliary: The liver is unremarkable in appearance. The gallbladder is unremarkable in appearance. The common bile duct remains normal in caliber. Pancreas: The pancreas is within normal limits. Spleen: The spleen is unremarkable in appearance. Adrenals/Urinary Tract: The adrenal glands are unremarkable in appearance. Nonobstructing bilateral renal stones measure up to 5 mm in size. There is no evidence of hydronephrosis. No obstructing ureteral stones are identified. No perinephric stranding is appreciated. Stomach/Bowel: The stomach is unremarkable in appearance. The small bowel is within normal limits. The appendix is normal in caliber, without evidence of appendicitis. Minimal diverticulosis is noted along the distal descending and proximal sigmoid colon, without evidence of diverticulitis. Vascular/Lymphatic: The abdominal aorta is unremarkable in appearance. The inferior vena cava is grossly unremarkable. No retroperitoneal lymphadenopathy is seen. No pelvic sidewall lymphadenopathy is identified. Reproductive: The bladder is mildly distended and grossly unremarkable. The prostate is borderline normal in size. Other: No additional soft tissue abnormalities are seen. Musculoskeletal: No acute osseous abnormalities are identified. The visualized musculature is unremarkable in appearance. IMPRESSION: 1. No  evidence of hydronephrosis. No obstructing ureteral stone seen. 2. Nonobstructing bilateral renal stones measure up to 5 mm in size. 3. Minimal diverticulosis along the distal descending and proximal sigmoid colon, without evidence of diverticulitis. Electronically Signed   By: Roanna Raider M.D.   On: 06/04/2017 03:40    Procedures Procedures (including critical care time)  Medications Ordered in ED Medications  ketorolac (TORADOL) 30 MG/ML injection 15 mg (15 mg Intravenous Given 06/04/17 0400)       Final Clinical Impressions(s) / ED Diagnoses   Final diagnoses:  Muscle pain   Return for fevers > 100.4 unrelieved by medication, weakness or numbness, stiff neck, intractable vomiting, or diarrhea, abdominal pain, Inability to tolerate liquids or food, cough, altered mental status or any concerns. No signs of systemic illness or infection. The patient is nontoxic-appearing on exam and vital signs are within normal limits.    I have reviewed the triage vital signs and the nursing notes. Pertinent labs &imaging results that were available during my care of the patient were reviewed by me and considered in my medical decision making (see chart for details).  After history, exam, and medical workup I feel the patient  has been appropriately medically screened and is safe for discharge home. Pertinent diagnoses were discussed with the patient. Patient was given return precautions    ED Discharge Orders        Ordered    Diclofenac Sodium CR (VOLTAREN-XR) 100 MG 24 hr tablet  Daily     06/04/17 0345       Yashas Camilli, MD 06/04/17 [phone]

## 2017-06-04 NOTE — ED Triage Notes (Signed)
Pt c/o left flank pain that started on Wednesday night. States pain when breathing in as well. Has a hx of kidney stone. States this pain feels a little different. Denies any n/v/d. Denies any fevers. States pain is constant and worse with certain sitting positions. Denies any urinary symptoms. Describes pain as sharp. Denies cold symptoms. States he had a 6 hour car ride yesterday. Denies sob.

## 2017-06-04 NOTE — ED Notes (Signed)
Pt and FM given d/c instructions as per chart. Rx x1. Verbalizes understanding. No questions. 

## 2017-06-04 NOTE — ED Notes (Addendum)
Pt describes sharp pain to the left flank and left side back area onset Wed. Pain is worse with movement and deep inspiration.Took Ibuprofen with no relief. Pain radiates to LLQ at times. Denies other s/s. Hx kidney stones, but states pain is usually more intense with those.

## 2017-06-04 NOTE — ED Notes (Signed)
To CT scan

## 2024-06-07 ENCOUNTER — Other Ambulatory Visit (HOSPITAL_BASED_OUTPATIENT_CLINIC_OR_DEPARTMENT_OTHER): Payer: Self-pay | Admitting: Female Pelvic Medicine and Reconstructive Surgery

## 2024-06-07 ENCOUNTER — Ambulatory Visit (HOSPITAL_BASED_OUTPATIENT_CLINIC_OR_DEPARTMENT_OTHER)
Admission: RE | Admit: 2024-06-07 | Discharge: 2024-06-07 | Disposition: A | Discharge status: Home / Self Care | Source: Ambulatory Visit | Attending: Female Pelvic Medicine and Reconstructive Surgery | Admitting: Female Pelvic Medicine and Reconstructive Surgery

## 2024-06-07 DIAGNOSIS — N200 Calculus of kidney: Secondary | ICD-10-CM

## 2024-06-07 DIAGNOSIS — Z0189 Encounter for other specified special examinations: Secondary | ICD-10-CM
